# Patient Record
Sex: Female | Born: 1955 | Race: White | Hispanic: No | Marital: Married
Health system: Southern US, Community
[De-identification: ages and names within clinical notes are randomized; demographics above are authoritative.]

## PROBLEM LIST (undated history)

## (undated) DIAGNOSIS — R7989 Other specified abnormal findings of blood chemistry: Secondary | ICD-10-CM

## (undated) DIAGNOSIS — J309 Allergic rhinitis, unspecified: Secondary | ICD-10-CM

## (undated) DIAGNOSIS — F32A Depression, unspecified: Secondary | ICD-10-CM

## (undated) DIAGNOSIS — Z860100 Personal history of colon polyps, unspecified: Secondary | ICD-10-CM

## (undated) DIAGNOSIS — F419 Anxiety disorder, unspecified: Secondary | ICD-10-CM

## (undated) DIAGNOSIS — R7303 Prediabetes: Secondary | ICD-10-CM

## (undated) DIAGNOSIS — F329 Major depressive disorder, single episode, unspecified: Secondary | ICD-10-CM

## (undated) DIAGNOSIS — D509 Iron deficiency anemia, unspecified: Secondary | ICD-10-CM

## (undated) DIAGNOSIS — I77819 Aortic ectasia, unspecified site: Secondary | ICD-10-CM

## (undated) DIAGNOSIS — K589 Irritable bowel syndrome without diarrhea: Secondary | ICD-10-CM

## (undated) DIAGNOSIS — J45909 Unspecified asthma, uncomplicated: Secondary | ICD-10-CM

## (undated) DIAGNOSIS — K219 Gastro-esophageal reflux disease without esophagitis: Secondary | ICD-10-CM

## (undated) DIAGNOSIS — J189 Pneumonia, unspecified organism: Secondary | ICD-10-CM

## (undated) DIAGNOSIS — E785 Hyperlipidemia, unspecified: Secondary | ICD-10-CM

## (undated) HISTORY — DX: Unspecified asthma, uncomplicated: J45.909

## (undated) HISTORY — DX: Irritable bowel syndrome, unspecified: K58.9

## (undated) HISTORY — DX: Major depressive disorder, single episode, unspecified: F32.9

## (undated) HISTORY — DX: Allergic rhinitis, unspecified: J30.9

## (undated) HISTORY — DX: Depression, unspecified: F32.A

## (undated) HISTORY — DX: Aortic ectasia, unspecified site: I77.819

## (undated) HISTORY — DX: Other specified abnormal findings of blood chemistry: R79.89

## (undated) HISTORY — DX: Prediabetes: R73.03

## (undated) HISTORY — DX: Gastro-esophageal reflux disease without esophagitis: K21.9

## (undated) HISTORY — DX: Personal history of colonic polyps: Z86.010

## (undated) HISTORY — DX: Anxiety disorder, unspecified: F41.9

## (undated) HISTORY — DX: Pneumonia, unspecified organism: J18.9

## (undated) HISTORY — DX: Iron deficiency anemia, unspecified: D50.9

## (undated) HISTORY — PX: TONSILLECTOMY AND ADENOIDECTOMY: SHX28

## (undated) HISTORY — DX: Personal history of colon polyps, unspecified: Z86.0100

## (undated) HISTORY — DX: Hyperlipidemia, unspecified: E78.5

---

## 2006-01-08 ENCOUNTER — Emergency Department (HOSPITAL_COMMUNITY): Admission: EM | Admit: 2006-01-08 | Discharge: 2006-01-08 | Payer: Self-pay | Admitting: *Deleted

## 2006-12-16 ENCOUNTER — Ambulatory Visit (HOSPITAL_COMMUNITY): Admission: RE | Admit: 2006-12-16 | Discharge: 2006-12-16 | Payer: Self-pay | Admitting: Obstetrics and Gynecology

## 2007-07-28 ENCOUNTER — Other Ambulatory Visit: Admission: RE | Admit: 2007-07-28 | Discharge: 2007-07-28 | Payer: Self-pay | Admitting: Family Medicine

## 2008-08-17 ENCOUNTER — Other Ambulatory Visit: Admission: RE | Admit: 2008-08-17 | Discharge: 2008-08-17 | Payer: Self-pay | Admitting: Family Medicine

## 2008-09-12 ENCOUNTER — Ambulatory Visit: Payer: Self-pay | Admitting: Gynecology

## 2008-10-03 HISTORY — PX: ENDOMETRIAL ABLATION: SHX621

## 2008-10-12 ENCOUNTER — Ambulatory Visit: Payer: Self-pay | Admitting: Gynecology

## 2008-10-17 ENCOUNTER — Ambulatory Visit: Payer: Self-pay | Admitting: Gynecology

## 2008-10-31 ENCOUNTER — Ambulatory Visit: Payer: Self-pay | Admitting: Gynecology

## 2009-08-02 ENCOUNTER — Other Ambulatory Visit: Admission: RE | Admit: 2009-08-02 | Discharge: 2009-08-02 | Payer: Self-pay | Admitting: Gynecology

## 2009-08-02 ENCOUNTER — Ambulatory Visit: Payer: Self-pay | Admitting: Gynecology

## 2010-08-08 ENCOUNTER — Ambulatory Visit: Payer: Self-pay | Admitting: Gynecology

## 2011-06-19 ENCOUNTER — Ambulatory Visit (INDEPENDENT_AMBULATORY_CARE_PROVIDER_SITE_OTHER): Payer: BC Managed Care – PPO | Admitting: Gynecology

## 2011-06-19 ENCOUNTER — Encounter: Payer: Self-pay | Admitting: Gynecology

## 2011-06-19 VITALS — BP 106/68

## 2011-06-19 DIAGNOSIS — N951 Menopausal and female climacteric states: Secondary | ICD-10-CM

## 2011-06-19 MED ORDER — ESTRADIOL 0.05 MG/24HR TD PTTW: MEDICATED_PATCH | TRANSDERMAL | Status: DC

## 2011-06-19 MED ORDER — PROGESTERONE MICRONIZED 100 MG PO CAPS: 100.0000 mg | ORAL_CAPSULE | Freq: Every day | ORAL | Status: DC

## 2011-06-19 NOTE — Progress Notes (Signed)
Patient presents with history of last menses year and a half ago with worsening hot flushes night sweats memory issues. I saw her a year ago talked about starting fluoxetine for some anxiety symptoms she never did this she tried over-the-counter soy and this does not seem to help her symptoms. I reviewed the issues of perimenopause and symptoms. The options for management include observation, OTC soy, black cohosh, prescription nonhormonal such as Effexor and prescription hormonal options. HRT was discussed to include the WHI study. The increased risk of stroke heart attack DVT uterine cancer and breast cancer was reviewed. The ACOG and NAMS statements for lowest dose for the shortest period of time discussed. Transdermal versus oral with first pass effect issues discussed. After lengthy review patient wants to go ahead and start and I prescribed Vivelle .05 patch and Prometrium 100 mg nightly. 3 month supply provided. She's actually due for her annual in December or January time frame she'll follow up at that point. If she does any bleeding or has any other issues she knows to call me.

## 2011-08-13 ENCOUNTER — Encounter: Payer: Self-pay | Admitting: Gynecology

## 2011-08-13 ENCOUNTER — Ambulatory Visit (INDEPENDENT_AMBULATORY_CARE_PROVIDER_SITE_OTHER): Payer: BC Managed Care – PPO | Admitting: Gynecology

## 2011-08-13 VITALS — BP 120/64 | Ht 64.0 in | Wt 133.0 lb

## 2011-08-13 DIAGNOSIS — Z01419 Encounter for gynecological examination (general) (routine) without abnormal findings: Secondary | ICD-10-CM

## 2011-08-13 DIAGNOSIS — N951 Menopausal and female climacteric states: Secondary | ICD-10-CM

## 2011-08-13 DIAGNOSIS — Z13220 Encounter for screening for lipoid disorders: Secondary | ICD-10-CM

## 2011-08-13 DIAGNOSIS — Z79890 Hormone replacement therapy (postmenopausal): Secondary | ICD-10-CM

## 2011-08-13 DIAGNOSIS — Z780 Asymptomatic menopausal state: Secondary | ICD-10-CM

## 2011-08-13 DIAGNOSIS — Z131 Encounter for screening for diabetes mellitus: Secondary | ICD-10-CM

## 2011-08-13 MED ORDER — PROGESTERONE MICRONIZED 100 MG PO CAPS: 100.0000 mg | ORAL_CAPSULE | Freq: Every day | ORAL | Status: DC

## 2011-08-13 MED ORDER — ESTRADIOL 0.05 MG/24HR TD PTTW: MEDICATED_PATCH | TRANSDERMAL | Status: DC

## 2011-08-13 NOTE — Progress Notes (Signed)
Terri Gallegos 07-Feb-1956 [id number]        55 y.o.  for annual exam.  Overall doing well. Recently started on HRT of Vivelle-Dot 0.05 and Prometrium 100 mg at bedtime and has had a great response with no hot flushes.  Past medical history,surgical history, medications, allergies, family history and social history were all reviewed and documented in the EPIC chart. ROS:  Was performed and pertinent positives and negatives are included in the history.  Exam: chaperone present Filed Vitals:   08/13/11 0853  BP: 120/64   General appearance  Normal Skin grossly normal Head/Neck normal with no cervical or supraclavicular adenopathy thyroid normal Lungs  clear Cardiac RR, without RMG Abdominal  soft, nontender, without masses, organomegaly or hernia Breasts  examined lying and sitting without masses, retractions, discharge or axillary adenopathy. Pelvic  Ext/BUS/vagina  normal   Cervix  normal  Pap done  Uterus  retroverted, normal size, shape and contour, midline and mobile nontender   Adnexa  Without masses or tenderness    Anus and perineum  normal   Rectovaginal  normal sphincter tone without palpated masses or tenderness.    Assessment/Plan:  55 y.o. female for annual exam.    1. HRT. I again reviewed the issues of HRT, WHI study with increased risk of stroke, heart attack, DVT, breast cancer. Patient's doing well wants to continue it I refilled her prescription. She knows to report any bleeding. 2. Bone health. We'll check vitamin D level and ordered a baseline DEXA. Increase calcium and vitamin D was recommended dosages reviewed. 3. Pap smear. Patient has not had a Pap smear in several years and I did one today. She has no history of significant atypia in the past, assuming this is normal we'll plan on every 3 year interval according to current guidelines. 4. Breast self. SBE monthly reviewed. She is due for her mammogram January 2013 and I reminded her to schedule  this. 5. Colonoscopy. Her last colonoscopy was 3 years ago she is up to date and will follow up per their recommendations. 6. Health maintenance. Baseline CBC glucose lipid profile urinalysis along with her vitamin D level was ordered. Assuming she continues well on HRT she will see me in a year sooner as needed.    Dara Lords MD, 9:39 AM 08/13/2011

## 2011-08-13 NOTE — Patient Instructions (Signed)
Continue with hormone replacement as discussed. Report any bleeding. Follow up for annual exam in one year. Schedule mammogram in January.

## 2011-09-12 ENCOUNTER — Other Ambulatory Visit: Payer: Self-pay | Admitting: Gynecology

## 2011-10-01 ENCOUNTER — Ambulatory Visit (INDEPENDENT_AMBULATORY_CARE_PROVIDER_SITE_OTHER): Payer: BC Managed Care – PPO

## 2011-10-01 DIAGNOSIS — Z780 Asymptomatic menopausal state: Secondary | ICD-10-CM

## 2011-10-01 DIAGNOSIS — Z13820 Encounter for screening for osteoporosis: Secondary | ICD-10-CM

## 2011-10-07 ENCOUNTER — Encounter: Payer: Self-pay | Admitting: Gynecology

## 2012-02-04 ENCOUNTER — Other Ambulatory Visit: Payer: Self-pay | Admitting: *Deleted

## 2012-02-04 DIAGNOSIS — E559 Vitamin D deficiency, unspecified: Secondary | ICD-10-CM

## 2012-07-17 ENCOUNTER — Other Ambulatory Visit: Payer: Self-pay | Admitting: Gynecology

## 2012-08-13 ENCOUNTER — Encounter: Payer: Self-pay | Admitting: Gynecology

## 2012-08-13 ENCOUNTER — Ambulatory Visit (INDEPENDENT_AMBULATORY_CARE_PROVIDER_SITE_OTHER): Payer: BC Managed Care – PPO | Admitting: Gynecology

## 2012-08-13 VITALS — BP 112/64 | Ht 64.0 in | Wt 135.0 lb

## 2012-08-13 DIAGNOSIS — L719 Rosacea, unspecified: Secondary | ICD-10-CM

## 2012-08-13 DIAGNOSIS — J069 Acute upper respiratory infection, unspecified: Secondary | ICD-10-CM

## 2012-08-13 DIAGNOSIS — Z79890 Hormone replacement therapy (postmenopausal): Secondary | ICD-10-CM

## 2012-08-13 DIAGNOSIS — Z13220 Encounter for screening for lipoid disorders: Secondary | ICD-10-CM

## 2012-08-13 DIAGNOSIS — Z01419 Encounter for gynecological examination (general) (routine) without abnormal findings: Secondary | ICD-10-CM

## 2012-08-13 LAB — COMPREHENSIVE METABOLIC PANEL
ALT: 24 U/L (ref 0–35)
AST: 23 U/L (ref 0–37)
Alkaline Phosphatase: 73 U/L (ref 39–117)
BUN: 11 mg/dL (ref 6–23)
Calcium: 9.1 mg/dL (ref 8.4–10.5)
Creat: 0.83 mg/dL (ref 0.50–1.10)
Total Bilirubin: 0.6 mg/dL (ref 0.3–1.2)

## 2012-08-13 LAB — CBC WITH DIFFERENTIAL/PLATELET
Basophils Absolute: 0 10*3/uL (ref 0.0–0.1)
Basophils Relative: 1 % (ref 0–1)
Eosinophils Absolute: 0.2 10*3/uL (ref 0.0–0.7)
Eosinophils Relative: 3 % (ref 0–5)
HCT: 41.2 % (ref 36.0–46.0)
Lymphocytes Relative: 25 % (ref 12–46)
MCHC: 34.2 g/dL (ref 30.0–36.0)
MCV: 90.5 fL (ref 78.0–100.0)
Monocytes Absolute: 0.4 10*3/uL (ref 0.1–1.0)
Platelets: 251 10*3/uL (ref 150–400)
RDW: 12.9 % (ref 11.5–15.5)
WBC: 6.1 10*3/uL (ref 4.0–10.5)

## 2012-08-13 LAB — LIPID PANEL
HDL: 64 mg/dL (ref >39)
LDL Cholesterol: 108 mg/dL — ABNORMAL HIGH (ref 0–99)
Total CHOL/HDL Ratio: 2.9 Ratio
VLDL: 15 mg/dL (ref 0–40)

## 2012-08-13 LAB — TSH: TSH: 1.407 u[IU]/mL (ref 0.350–4.500)

## 2012-08-13 MED ORDER — BENZONATATE 200 MG PO CAPS: 200.0000 mg | ORAL_CAPSULE | Freq: Three times a day (TID) | ORAL | Status: DC | PRN

## 2012-08-13 MED ORDER — ESTRADIOL 0.05 MG/24HR TD PTTW: 1.0000 | MEDICATED_PATCH | TRANSDERMAL | Status: DC

## 2012-08-13 MED ORDER — PROGESTERONE MICRONIZED 100 MG PO CAPS: 100.0000 mg | ORAL_CAPSULE | Freq: Every day | ORAL | Status: DC

## 2012-08-13 MED ORDER — METRONIDAZOLE 0.75 % EX CREA: TOPICAL_CREAM | Freq: Two times a day (BID) | CUTANEOUS | Status: DC

## 2012-08-13 NOTE — Progress Notes (Signed)
Terri Gallegos 06-Dec-1955 [id number]        56 y.o.  G3P3003 for annual exam.  Several issues noted below.  Past medical history,surgical history, medications, allergies, family history and social history were all reviewed and documented in the EPIC chart. ROS:  Was performed and pertinent positives and negatives are included in the history.  Exam: Sherrilyn Rist assistant Filed Vitals:   08/13/12 1010  BP: 112/64  Height: 5\' 4"  (1.626 m)  Weight: 135 lb (61.236 kg)   General appearance  Normal Skin grossly normal Head/Neck normal with no cervical or supraclavicular adenopathy thyroid normal Lungs  clear Cardiac RR, without RMG Abdominal  soft, nontender, without masses, organomegaly or hernia Breasts  examined lying and sitting without masses, retractions, discharge or axillary adenopathy. Pelvic  Ext/BUS/vagina  normal with atrophic changes  Cervix  normal with atrophic changes  Uterus  anteverted, normal size, shape and contour, midline and mobile nontender   Adnexa  Without masses or tenderness    Anus and perineum  normal   Rectovaginal  normal sphincter tone without palpated masses or tenderness.    Assessment/Plan:  56 y.o. G51P3003 female for annual exam.   1. Postmenopausal on HRT of Vivelle 0.05 patch and Prometrium 100 mg nightly. No bleeding with great symptom relief, started last year.  Again reviewed HRT to include ACOG and NAMS statements the lowest dose the shortest period of time. Risks of stroke heart attack DVT breast cancer reviewed. Patient wants to continue I refilled her prescriptions x1 year.  Knows to report any bleeding. 2. Persistent cough. Treated with course of antibiotics Tussionex by her primary. Getting better over all no sputum or fevers or chills. Tessalon Perles 200 #20 with one refill provided, follow up with primary physician if persists or worsens. 3. Mammography 10/2011. Follow up for annual mammography when due. SBE monthly reviewed. 4. DEXA 09/2011  normal.  Repeat in 5 year interval. Vitamin D last year was low a 22. She was to increase vitamin D intake in follow up and repeat value which he never did. Check vitamin D today. 5. Colonoscopy 3 years ago. Patient will follow up at recommended intervals. 6. Pap smear 08/2011.  No Pap smear done today. No history of significant abnormal Pap smears. Plan 3 year follow up Pap. 7. Rosacea. Patient asked for refill of her metronidazole cream 0.75% that her dermatologist has prescribed she ran out. I refilled her times 2. 8. Health maintenance. Baseline CBC comprehensive metabolic panel lipid profile TSH urinalysis ordered along with her vitamin D. Follow up one year, sooner as needed.    Dara Lords MD, 10:35 AM 08/13/2012

## 2012-08-13 NOTE — Patient Instructions (Signed)
Follow up in one year for annual exam. Continue on hormone replacement. Call if any bleeding or any questions at all.

## 2012-08-14 ENCOUNTER — Other Ambulatory Visit: Payer: Self-pay | Admitting: Gynecology

## 2012-08-14 LAB — URINALYSIS W MICROSCOPIC + REFLEX CULTURE
Bacteria, UA: NONE SEEN
Casts: NONE SEEN
Hgb urine dipstick: NEGATIVE
Ketones, ur: NEGATIVE mg/dL
Nitrite: NEGATIVE
Protein, ur: NEGATIVE mg/dL
pH: 7 (ref 5.0–8.0)

## 2012-08-14 LAB — VITAMIN D 25 HYDROXY (VIT D DEFICIENCY, FRACTURES): Vit D, 25-Hydroxy: 26 ng/mL — ABNORMAL LOW (ref 30–89)

## 2012-10-04 ENCOUNTER — Other Ambulatory Visit: Payer: Self-pay | Admitting: Gynecology

## 2012-10-07 ENCOUNTER — Encounter: Payer: Self-pay | Admitting: Gynecology

## 2012-10-17 ENCOUNTER — Other Ambulatory Visit: Payer: Self-pay

## 2012-11-03 ENCOUNTER — Other Ambulatory Visit: Payer: Self-pay | Admitting: Gynecology

## 2012-12-03 ENCOUNTER — Other Ambulatory Visit: Payer: Self-pay | Admitting: Gynecology

## 2013-01-21 ENCOUNTER — Other Ambulatory Visit: Payer: Self-pay | Admitting: *Deleted

## 2013-01-21 MED ORDER — MINIVELLE 0.05 MG/24HR TD PTTW: 1.0000 | MEDICATED_PATCH | TRANSDERMAL | Status: DC

## 2013-02-02 ENCOUNTER — Other Ambulatory Visit: Payer: Self-pay | Admitting: Gynecology

## 2013-02-02 MED ORDER — MINIVELLE 0.05 MG/24HR TD PTTW: 1.0000 | MEDICATED_PATCH | TRANSDERMAL | Status: DC

## 2013-04-28 ENCOUNTER — Encounter: Payer: Self-pay | Admitting: Gynecology

## 2013-04-28 ENCOUNTER — Ambulatory Visit (INDEPENDENT_AMBULATORY_CARE_PROVIDER_SITE_OTHER): Payer: 59 | Admitting: Gynecology

## 2013-04-28 DIAGNOSIS — L723 Sebaceous cyst: Secondary | ICD-10-CM

## 2013-04-28 DIAGNOSIS — L989 Disorder of the skin and subcutaneous tissue, unspecified: Secondary | ICD-10-CM

## 2013-04-28 NOTE — Progress Notes (Signed)
Patient presents complaining of perirectal bumps. Notes onset of a rash on her right forearm that she has an appointment to see her dermatologist but then subsequently developed a red rash on her buttocks and then bumps around her anal area.  Exam was Kim Asst. Upper extremity shows a scaly patch lesion right forearm. Suspicious for squamous cell/basal cell carcinoma. May be unusual seborrheic keratoses.  Pelvic External with 3 small classic appearing sebaceous cysts right of the anal area. The largest is 1 cm and pointing. The other 2 are small several millimeters. Buttocks area without evidence of rash.  Procedure: Area overlying the pointing larger sebaceous cyst was cleansed with alcohol and using an 18-gauge needle the pointing area was opened and sebaceous-type material extruded to including a seed area.  The area was completely emptied. Patient tolerated well.  Assessment and plan: 1. Lesion right forearm suspicious for skin cancer. Patient has appointment with dermatologist for a scheduled and will followup with them. I discussed my concern with the patient and she knows importance of followup. 2. Sebaceous cyst right peri-anal area, largest area drained. 2 smaller areas which have asked the patient to watch. As long as they remain the same or resolve then we will follow. If they enlarge or become painful or change at all she is to represent for evaluation. She has her annual exam scheduled in several months and we'll reassess at that time as long as they remain quiet. 3. Reported rash upper buttocks has resolved with no residual abnormalities.

## 2013-04-28 NOTE — Patient Instructions (Addendum)
Monitor the perianal bumps. As long as they remain stable then we will follow. If they enlarger or change then followup with me. Followup to see the dermatologist to assess the area on your right forearm.

## 2013-07-08 ENCOUNTER — Other Ambulatory Visit: Payer: Self-pay

## 2013-08-17 ENCOUNTER — Ambulatory Visit (INDEPENDENT_AMBULATORY_CARE_PROVIDER_SITE_OTHER): Payer: 59 | Admitting: Gynecology

## 2013-08-17 ENCOUNTER — Encounter: Payer: Self-pay | Admitting: Gynecology

## 2013-08-17 VITALS — BP 110/60 | Ht 64.0 in | Wt 138.0 lb

## 2013-08-17 DIAGNOSIS — Z01419 Encounter for gynecological examination (general) (routine) without abnormal findings: Secondary | ICD-10-CM

## 2013-08-17 DIAGNOSIS — E559 Vitamin D deficiency, unspecified: Secondary | ICD-10-CM

## 2013-08-17 DIAGNOSIS — Z79890 Hormone replacement therapy (postmenopausal): Secondary | ICD-10-CM

## 2013-08-17 DIAGNOSIS — F411 Generalized anxiety disorder: Secondary | ICD-10-CM

## 2013-08-17 LAB — CBC WITH DIFFERENTIAL/PLATELET
Basophils Absolute: 0 10*3/uL (ref 0.0–0.1)
Basophils Relative: 1 % (ref 0–1)
Hemoglobin: 13.8 g/dL (ref 12.0–15.0)
Lymphocytes Relative: 34 % (ref 12–46)
MCHC: 34.8 g/dL (ref 30.0–36.0)
Neutro Abs: 2.1 10*3/uL (ref 1.7–7.7)
Neutrophils Relative %: 52 % (ref 43–77)
RDW: 13.6 % (ref 11.5–15.5)
WBC: 3.9 10*3/uL — ABNORMAL LOW (ref 4.0–10.5)

## 2013-08-17 MED ORDER — FLUOXETINE HCL 20 MG PO CAPS: 20.0000 mg | ORAL_CAPSULE | Freq: Every day | ORAL | Status: DC

## 2013-08-17 NOTE — Patient Instructions (Signed)
Start on Prozac as we discussed. After being on this for several weeks stopped her hormone replacement therapy and we'll see how you do. Call me if you have any questions or issues. Followup in one year for annual exam.

## 2013-08-17 NOTE — Progress Notes (Signed)
Terri Gallegos [birth year] [id number]        57 y.o.  G3P3003 for annual exam.  Several issues noted below.  Past medical history,surgical history, problem list, medications, allergies, family history and social history were all reviewed and documented in the EPIC chart.  ROS:  Performed and pertinent positives and negatives are included in the history, assessment and plan .  Exam: Kim assistant Filed Vitals:   08/17/13 0820  BP: 110/60  Height: 5\' 4"  (1.626 m)  Weight: 138 lb (62.596 kg)   General appearance  Normal Skin grossly normal Head/Neck normal with no cervical or supraclavicular adenopathy thyroid normal Lungs  clear Cardiac RR, without RMG Abdominal  soft, nontender, without masses, organomegaly or hernia Breasts  examined lying and sitting without masses, retractions, discharge or axillary adenopathy. Pelvic  Ext/BUS/vagina  Normal   Cervix  Normal   Uterus  retroverted, normal size, shape and contour, midline and mobile nontender   Adnexa  Without masses or tenderness    Anus and perineum  Normal   Rectovaginal  Normal sphincter tone without palpated masses or tenderness.    Assessment/Plan:  57 y.o. G70P3003 female for annual exam.   1. Postmenopausal/HRT. Patient is on Minivelle 0.05 mg and Prometrium 100 mg nightly. Had questions about stopping it. I again reviewed the whole issue of HRT to includes the WHI study with risks of stroke heart attack DVT and breast cancer. The ACOG and NAMS statements for lowest dose for shortest period of time also reviewed. Transdermal versus oral first-pass effect issues discussed. She is having a lot of issues with the patches for her skin breakout. After lengthy discussion she is going to try to wean after starting Prozac per #2 we'll see how she does. If she does have significant symptoms then she'll call and we'll reinitiate HRT but start her on estradiol oral 0.5 mg along with her Prometrium 100 mg nightly. 2. Anxiety. Patient is  having a lot of anxiety dealing with her son who has an addiction. Various options and strategies reviewed and ultimately we decided a trial of fluoxetine 20 mg daily. Side effect profile and risks to include suicide ideation discussed. Patient will call me if she has any issues. #30 with 11 refills provided. 3. Pap smear 2012. No Pap smear done today. No history of significant abnormal Pap smears. Plan repeat Pap smear next year a 3 year interval. 4. Mammography 10/2012. Continue with annual mammography. SBE monthly reviewed. 5. DEXA 2013 normal. Does have a history of vitamin D deficiency. Check vitamin D level today. Increase vitamin D/calcium recommendations reviewed. 6. Colonoscopy 3-4 years ago. Repeat at their recommended interval. 7. Health maintenance. Baseline CBC comprehensive metabolic panel lipid profile TSH vitamin D urinalysis ordered. Followup 1 year, sooner as noted above.   Note: This document was prepared with digital dictation and possible smart phrase technology. Any transcriptional errors that result from this process are unintentional.   Dara Lords MD, 8:46 AM 08/17/2013

## 2013-08-18 LAB — COMPREHENSIVE METABOLIC PANEL
ALT: 17 U/L (ref 0–35)
AST: 18 U/L (ref 0–37)
Albumin: 4 g/dL (ref 3.5–5.2)
Alkaline Phosphatase: 57 U/L (ref 39–117)
Chloride: 106 mEq/L (ref 96–112)
Potassium: 4 mEq/L (ref 3.5–5.3)
Sodium: 139 mEq/L (ref 135–145)
Total Protein: 6.2 g/dL (ref 6.0–8.3)

## 2013-08-18 LAB — URINALYSIS W MICROSCOPIC + REFLEX CULTURE

## 2013-08-18 LAB — TSH: TSH: 2.58 u[IU]/mL (ref 0.350–4.500)

## 2013-08-18 LAB — LIPID PANEL: LDL Cholesterol: 107 mg/dL — ABNORMAL HIGH (ref 0–99)

## 2013-08-19 ENCOUNTER — Telehealth: Payer: Self-pay

## 2013-08-19 ENCOUNTER — Other Ambulatory Visit: Payer: Self-pay

## 2013-08-19 NOTE — Telephone Encounter (Signed)
You put her on Prozac at visit. She took first one night before last and had a bad day yesterday feeling agitated, nervous and restless. Even went to see the school nurse and her BP was up. She said it is never high.  She thought maybe it was because she took it with her Benadryl hs that she uses to help sleep. So, last night she did not take her Doree Barthel and she is having a much better day today except is very sleepy due to fact she was awake every hour without taking her Benadryl.  She said she needs to sleep and the last week or two has not even been sleeping well with Benadryl.  She wanted me to run that by you and see what you recommend she do to help with sleep issues and do you think that the way she felt yesterday might be because of taking it with Benadryl.

## 2013-08-19 NOTE — Telephone Encounter (Signed)
Patient said lately the Benadryl had not been helping her sleep. She is awake every hour the last few weeks.  She said she meant to ask at visit if you would prescribe something.  She will try the Prozac am and take her Benadryl tonight. Rx?

## 2013-08-19 NOTE — Telephone Encounter (Signed)
I would recommend trying the Prozac in the morning and then she can take the Benadryl at nighttime and see how that does. If need be we can cut the dose in half and do Prozac 10 mg

## 2013-08-20 ENCOUNTER — Other Ambulatory Visit: Payer: Self-pay | Admitting: Gynecology

## 2013-08-20 DIAGNOSIS — Z01419 Encounter for gynecological examination (general) (routine) without abnormal findings: Secondary | ICD-10-CM

## 2013-08-20 MED ORDER — ZOLPIDEM TARTRATE 5 MG PO TABS: 5.0000 mg | ORAL_TABLET | Freq: Every evening | ORAL | Status: DC | PRN

## 2013-08-20 NOTE — Telephone Encounter (Signed)
Patient advised. Rx in. 

## 2013-08-20 NOTE — Telephone Encounter (Signed)
We can try a short course of Ambien 5 mg to see if we cannot break the cycle. I do not want to see her needing it every night though. #30 with 1 refill

## 2013-08-20 NOTE — Progress Notes (Signed)
Ambien 5mg  Rx was called in to pharmacy.

## 2013-08-23 ENCOUNTER — Other Ambulatory Visit: Payer: Self-pay | Admitting: *Deleted

## 2013-09-01 ENCOUNTER — Other Ambulatory Visit: Payer: 59

## 2013-09-16 ENCOUNTER — Other Ambulatory Visit: Payer: Self-pay

## 2013-09-16 MED ORDER — ZOLPIDEM TARTRATE 5 MG PO TABS: 5.0000 mg | ORAL_TABLET | Freq: Every evening | ORAL | Status: DC | PRN

## 2013-09-16 NOTE — Telephone Encounter (Signed)
Called into pharmacy

## 2013-09-21 ENCOUNTER — Other Ambulatory Visit: Payer: Self-pay

## 2013-09-21 MED ORDER — PROGESTERONE MICRONIZED 100 MG PO CAPS: 100.0000 mg | ORAL_CAPSULE | Freq: Every day | ORAL | Status: DC

## 2013-09-24 ENCOUNTER — Telehealth: Payer: Self-pay | Admitting: *Deleted

## 2013-09-24 NOTE — Telephone Encounter (Signed)
(  Pt aware you are out of the office) pt was prescribed Prozac 20 mg on OV 08/17/13 she stopped medication because it made her feel anxious, no appetite just didn't feel well. Pt spoke with the pharmacist and he suggest maybe lexapro. She also mention skin irritation from minivelle patches and how she really would like to stop taking HRT, pharmacist told her that maybe Effexor would help her as well.  Pt would like to know what you think? Please advise

## 2013-09-27 MED ORDER — VENLAFAXINE HCL ER 37.5 MG PO CP24: 37.5000 mg | ORAL_CAPSULE | Freq: Every day | ORAL | Status: DC

## 2013-09-27 NOTE — Telephone Encounter (Signed)
Recommend stopping HRT. Start Effexor XR 37.5 mg daily x2 weeks. Call me at the end of the 2 weeks to see how she's doing. At that point we will make a decision about increasing her to the 75 mg if she is doing well. Hopefully Effexor will address both her menopausal symptoms and her anxiety.

## 2013-09-27 NOTE — Telephone Encounter (Signed)
Pt informed with the below note, rx sent. 

## 2013-10-14 ENCOUNTER — Telehealth: Payer: Self-pay | Admitting: *Deleted

## 2013-10-14 MED ORDER — VENLAFAXINE HCL ER 37.5 MG PO CP24: 37.5000 mg | ORAL_CAPSULE | Freq: Every day | ORAL | Status: DC

## 2013-10-14 NOTE — Telephone Encounter (Signed)
Pt had good results from effexor XR and refill will be sent,pt did not want to continue with HRT so she is going to stop this.

## 2013-10-14 NOTE — Telephone Encounter (Signed)
Pt calling with update from telephone encounter 09/24/13. Pt never stopped taking HRT as directed, she took the Effexor XR 37.5 mg daily x2 weeks as well with HRT, I explained to patient that was not your directions, so pt is calling today requesting refill on Effexor 37.5. Please advise

## 2013-10-14 NOTE — Telephone Encounter (Signed)
I assume that she had a good response to the Effexor XR and if so then we can refill the 37.5 mg. She will have to decide about the HRT. If she wants to continue this also than that is okay

## 2013-12-08 ENCOUNTER — Telehealth: Payer: Self-pay

## 2013-12-08 MED ORDER — VENLAFAXINE HCL ER 75 MG PO CP24: 75.0000 mg | ORAL_CAPSULE | Freq: Every day | ORAL | Status: DC

## 2013-12-08 NOTE — Telephone Encounter (Signed)
Okay to increase Effexor XR to 75 mg daily #30 with refills through her next annual

## 2013-12-08 NOTE — Telephone Encounter (Addendum)
Patient said she has been on Effexor x ~2 mos . She said 3 weeks ago she d/c her HRT. Now she is having bad hotflashes. She is questioning can the Effexor be increased.  Looks like she is taking 37.5.

## 2014-06-17 ENCOUNTER — Other Ambulatory Visit: Payer: Self-pay

## 2014-07-04 ENCOUNTER — Encounter: Payer: Self-pay | Admitting: Gynecology

## 2014-07-12 ENCOUNTER — Encounter: Payer: Self-pay | Admitting: Gynecology

## 2014-07-26 ENCOUNTER — Other Ambulatory Visit: Payer: Self-pay | Admitting: Gynecology

## 2014-08-18 ENCOUNTER — Ambulatory Visit (INDEPENDENT_AMBULATORY_CARE_PROVIDER_SITE_OTHER): Payer: 59 | Admitting: Gynecology

## 2014-08-18 ENCOUNTER — Encounter: Payer: Self-pay | Admitting: Gynecology

## 2014-08-18 ENCOUNTER — Other Ambulatory Visit (HOSPITAL_COMMUNITY)
Admission: RE | Admit: 2014-08-18 | Discharge: 2014-08-18 | Disposition: A | Discharge status: Home / Self Care | Payer: 59 | Source: Ambulatory Visit | Attending: Gynecology | Admitting: Gynecology

## 2014-08-18 VITALS — BP 114/60 | Ht 64.0 in | Wt 125.0 lb

## 2014-08-18 DIAGNOSIS — Z01419 Encounter for gynecological examination (general) (routine) without abnormal findings: Secondary | ICD-10-CM | POA: Diagnosis not present

## 2014-08-18 DIAGNOSIS — Z1151 Encounter for screening for human papillomavirus (HPV): Secondary | ICD-10-CM | POA: Insufficient documentation

## 2014-08-18 DIAGNOSIS — N888 Other specified noninflammatory disorders of cervix uteri: Secondary | ICD-10-CM

## 2014-08-18 LAB — CBC WITH DIFFERENTIAL/PLATELET
BASOS ABS: 0 10*3/uL (ref 0.0–0.1)
Basophils Relative: 0 % (ref 0–1)
Eosinophils Absolute: 0.3 10*3/uL (ref 0.0–0.7)
Eosinophils Relative: 5 % (ref 0–5)
HCT: 40 % (ref 36.0–46.0)
Hemoglobin: 14.1 g/dL (ref 12.0–15.0)
Lymphocytes Relative: 33 % (ref 12–46)
Lymphs Abs: 1.7 10*3/uL (ref 0.7–4.0)
MCH: 30.9 pg (ref 26.0–34.0)
MCHC: 35.3 g/dL (ref 30.0–36.0)
MCV: 87.5 fL (ref 78.0–100.0)
MONO ABS: 0.3 10*3/uL (ref 0.1–1.0)
MONOS PCT: 5 % (ref 3–12)
MPV: 9.7 fL (ref 9.4–12.4)
NEUTROS ABS: 3 10*3/uL (ref 1.7–7.7)
NEUTROS PCT: 57 % (ref 43–77)
PLATELETS: 252 10*3/uL (ref 150–400)
RBC: 4.57 MIL/uL (ref 3.87–5.11)
RDW: 12.7 % (ref 11.5–15.5)
WBC: 5.3 10*3/uL (ref 4.0–10.5)

## 2014-08-18 LAB — COMPREHENSIVE METABOLIC PANEL
ALBUMIN: 4.3 g/dL (ref 3.5–5.2)
ALT: 31 U/L (ref 0–35)
AST: 31 U/L (ref 0–37)
Alkaline Phosphatase: 72 U/L (ref 39–117)
BILIRUBIN TOTAL: 0.5 mg/dL (ref 0.2–1.2)
BUN: 15 mg/dL (ref 6–23)
CO2: 28 mEq/L (ref 19–32)
Calcium: 9.3 mg/dL (ref 8.4–10.5)
Chloride: 101 mEq/L (ref 96–112)
Creat: 0.97 mg/dL (ref 0.50–1.10)
GLUCOSE: 83 mg/dL (ref 70–99)
POTASSIUM: 4 meq/L (ref 3.5–5.3)
SODIUM: 138 meq/L (ref 135–145)
Total Protein: 6.6 g/dL (ref 6.0–8.3)

## 2014-08-18 LAB — LIPID PANEL
Cholesterol: 233 mg/dL — ABNORMAL HIGH (ref 0–200)
HDL: 75 mg/dL (ref >39)
LDL Cholesterol: 142 mg/dL — ABNORMAL HIGH (ref 0–99)
TRIGLYCERIDES: 81 mg/dL (ref <150)
Total CHOL/HDL Ratio: 3.1 Ratio
VLDL: 16 mg/dL (ref 0–40)

## 2014-08-18 LAB — TSH: TSH: 2.39 u[IU]/mL (ref 0.350–4.500)

## 2014-08-18 MED ORDER — VENLAFAXINE HCL ER 75 MG PO CP24: 75.0000 mg | ORAL_CAPSULE | Freq: Every day | ORAL | Status: DC

## 2014-08-18 MED ORDER — ZOLPIDEM TARTRATE 5 MG PO TABS: 5.0000 mg | ORAL_TABLET | Freq: Every evening | ORAL | Status: DC | PRN

## 2014-08-18 MED ORDER — METRONIDAZOLE 0.75 % EX CREA: TOPICAL_CREAM | Freq: Two times a day (BID) | CUTANEOUS | Status: AC

## 2014-08-18 NOTE — Patient Instructions (Signed)
Office will call you with biopsy results.  You may obtain a copy of any labs that were done today by logging onto MyChart as outlined in the instructions provided with your AVS (after visit summary). The office will not call with normal lab results but certainly if there are any significant abnormalities then we will contact you.   Health Maintenance, Female A healthy lifestyle and preventative care can promote health and wellness.  Maintain regular health, dental, and eye exams.  Eat a healthy diet. Foods like vegetables, fruits, whole grains, low-fat dairy products, and lean protein foods contain the nutrients you need without too many calories. Decrease your intake of foods high in solid fats, added sugars, and salt. Get information about a proper diet from your caregiver, if necessary.  Regular physical exercise is one of the most important things you can do for your health. Most adults should get at least 150 minutes of moderate-intensity exercise (any activity that increases your heart rate and causes you to sweat) each week. In addition, most adults need muscle-strengthening exercises on 2 or more days a week.   Maintain a healthy weight. The body mass index (BMI) is a screening tool to identify possible weight problems. It provides an estimate of body fat based on height and weight. Your caregiver can help determine your BMI, and can help you achieve or maintain a healthy weight. For adults 20 years and older:  A BMI below 18.5 is considered underweight.  A BMI of 18.5 to 24.9 is normal.  A BMI of 25 to 29.9 is considered overweight.  A BMI of 30 and above is considered obese.  Maintain normal blood lipids and cholesterol by exercising and minimizing your intake of saturated fat. Eat a balanced diet with plenty of fruits and vegetables. Blood tests for lipids and cholesterol should begin at age 20 and be repeated every 5 years. If your lipid or cholesterol levels are high, you are  over 50, or you are a high risk for heart disease, you may need your cholesterol levels checked more frequently.Ongoing high lipid and cholesterol levels should be treated with medicines if diet and exercise are not effective.  If you smoke, find out from your caregiver how to quit. If you do not use tobacco, do not start.  Lung cancer screening is recommended for adults aged 55 80 years who are at high risk for developing lung cancer because of a history of smoking. Yearly low-dose computed tomography (CT) is recommended for people who have at least a 30-pack-year history of smoking and are a current smoker or have quit within the past 15 years. A pack year of smoking is smoking an average of 1 pack of cigarettes a day for 1 year (for example: 1 pack a day for 30 years or 2 packs a day for 15 years). Yearly screening should continue until the smoker has stopped smoking for at least 15 years. Yearly screening should also be stopped for people who develop a health problem that would prevent them from having lung cancer treatment.  If you are pregnant, do not drink alcohol. If you are breastfeeding, be very cautious about drinking alcohol. If you are not pregnant and choose to drink alcohol, do not exceed 1 drink per day. One drink is considered to be 12 ounces (355 mL) of beer, 5 ounces (148 mL) of wine, or 1.5 ounces (44 mL) of liquor.  Avoid use of street drugs. Do not share needles with anyone. Ask for help   if you need support or instructions about stopping the use of drugs.  High blood pressure causes heart disease and increases the risk of stroke. Blood pressure should be checked at least every 1 to 2 years. Ongoing high blood pressure should be treated with medicines, if weight loss and exercise are not effective.  If you are 55 to 58 years old, ask your caregiver if you should take aspirin to prevent strokes.  Diabetes screening involves taking a blood sample to check your fasting blood sugar level.  This should be done once every 3 years, after age 45, if you are within normal weight and without risk factors for diabetes. Testing should be considered at a younger age or be carried out more frequently if you are overweight and have at least 1 risk factor for diabetes.  Breast cancer screening is essential preventative care for women. You should practice "breast self-awareness." This means understanding the normal appearance and feel of your breasts and may include breast self-examination. Any changes detected, no matter how small, should be reported to a caregiver. Women in their 20s and 30s should have a clinical breast exam (CBE) by a caregiver as part of a regular health exam every 1 to 3 years. After age 40, women should have a CBE every year. Starting at age 40, women should consider having a mammogram (breast X-ray) every year. Women who have a family history of breast cancer should talk to their caregiver about genetic screening. Women at a high risk of breast cancer should talk to their caregiver about having an MRI and a mammogram every year.  Breast cancer gene (BRCA)-related cancer risk assessment is recommended for women who have family members with BRCA-related cancers. BRCA-related cancers include breast, ovarian, tubal, and peritoneal cancers. Having family members with these cancers may be associated with an increased risk for harmful changes (mutations) in the breast cancer genes BRCA1 and BRCA2. Results of the assessment will determine the need for genetic counseling and BRCA1 and BRCA2 testing.  The Pap test is a screening test for cervical cancer. Women should have a Pap test starting at age 21. Between ages 21 and 29, Pap tests should be repeated every 2 years. Beginning at age 30, you should have a Pap test every 3 years as long as the past 3 Pap tests have been normal. If you had a hysterectomy for a problem that was not cancer or a condition that could lead to cancer, then you no  longer need Pap tests. If you are between ages 65 and 70, and you have had normal Pap tests going back 10 years, you no longer need Pap tests. If you have had past treatment for cervical cancer or a condition that could lead to cancer, you need Pap tests and screening for cancer for at least 20 years after your treatment. If Pap tests have been discontinued, risk factors (such as a new sexual partner) need to be reassessed to determine if screening should be resumed. Some women have medical problems that increase the chance of getting cervical cancer. In these cases, your caregiver may recommend more frequent screening and Pap tests.  The human papillomavirus (HPV) test is an additional test that may be used for cervical cancer screening. The HPV test looks for the virus that can cause the cell changes on the cervix. The cells collected during the Pap test can be tested for HPV. The HPV test could be used to screen women aged 30 years and older, and   should be used in women of any age who have unclear Pap test results. After the age of 7, women should have HPV testing at the same frequency as a Pap test.  Colorectal cancer can be detected and often prevented. Most routine colorectal cancer screening begins at the age of 20 and continues through age 36. However, your caregiver may recommend screening at an earlier age if you have risk factors for colon cancer. On a yearly basis, your caregiver may provide home test kits to check for hidden blood in the stool. Use of a small camera at the end of a tube, to directly examine the colon (sigmoidoscopy or colonoscopy), can detect the earliest forms of colorectal cancer. Talk to your caregiver about this at age 46, when routine screening begins. Direct examination of the colon should be repeated every 5 to 10 years through age 62, unless early forms of pre-cancerous polyps or small growths are found.  Hepatitis C blood testing is recommended for all people born  from [birth year] through 1965 and any individual with known risks for hepatitis C.  Practice safe sex. Use condoms and avoid high-risk sexual practices to reduce the spread of sexually transmitted infections (STIs). Sexually active women aged 55 and younger should be checked for Chlamydia, which is a common sexually transmitted infection. Older women with new or multiple partners should also be tested for Chlamydia. Testing for other STIs is recommended if you are sexually active and at increased risk.  Osteoporosis is a disease in which the bones lose minerals and strength with aging. This can result in serious bone fractures. The risk of osteoporosis can be identified using a bone density scan. Women ages 31 and over and women at risk for fractures or osteoporosis should discuss screening with their caregivers. Ask your caregiver whether you should be taking a calcium supplement or vitamin D to reduce the rate of osteoporosis.  Menopause can be associated with physical symptoms and risks. Hormone replacement therapy is available to decrease symptoms and risks. You should talk to your caregiver about whether hormone replacement therapy is right for you.  Use sunscreen. Apply sunscreen liberally and repeatedly throughout the day. You should seek shade when your shadow is shorter than you. Protect yourself by wearing long sleeves, pants, a wide-brimmed hat, and sunglasses year round, whenever you are outdoors.  Notify your caregiver of new moles or changes in moles, especially if there is a change in shape or color. Also notify your caregiver if a mole is larger than the size of a pencil eraser.  Stay current with your immunizations. Document Released: 03/04/2011 Document Revised: 12/14/2012 Document Reviewed: 03/04/2011 [facility] Patient Information 2014 [place].

## 2014-08-18 NOTE — Progress Notes (Signed)
Terri PolesDale H Birdsall 1956-03-24 [id number]        58 y.o.  G3P3003 for annual exam.  Several issues noted below.  Past medical history,surgical history, problem list, medications, allergies, family history and social history were all reviewed and documented as reviewed in the EPIC chart.  ROS:  Performed with pertinent positives and negatives included in the history, assessment and plan.   Additional significant findings :  none   Exam: [facility] personassistant Filed Vitals:   08/18/14 1155  BP: 114/60  Height: 5\' 4"  (1.626 m)  Weight: 125 lb (56.7 kg)   General appearance:  Normal affect, orientation and appearance. Skin: Grossly normal HEENT: Without gross lesions.  No cervical or supraclavicular adenopathy. Thyroid normal.  Lungs:  Clear without wheezing, rales or rhonchi Cardiac: RR, without RMG Abdominal:  Soft, nontender, without masses, guarding, rebound, organomegaly or hernia Breasts:  Examined lying and sitting without masses, retractions, discharge or axillary adenopathy. Pelvic:  Ext/BUS/vagina with atrophic changes  Cervix atrophic. Polypoid mass extruding from cervix. Biopsied off consistent with nabothian cyst.  Pap/HPV  Uterus anteverted, normal size, shape and contour, midline and mobile nontender   Adnexa  Without masses or tenderness    Anus and perineum  Normal   Rectovaginal  Normal sphincter tone without palpated masses or tenderness.    Assessment/Plan:  58 y.o. 693P3003 female for annual exam.   1. Postmenopausal/atrophic genital changes. Had been on HRT and she weaned herself off.  Subsequently started on Effexor XR 75 mg. Doing well with this without significant hot flashes night sweats. No significant vaginal dryness or dyspareunia. No vaginal bleeding. Continue to monitor. Report any vaginal bleeding. 2. Polypoid mass on cervix consistent with nabothian cyst. Biopsied off and sent to pathology. Patient will follow up for results. Assuming negative then plan expectant  management. 3. Pap smear 2012. Pap/HPV today. No history of significant abnormal Pap smears previously. Plan repeat Pap smear at 3-5 year interval assuming this Pap smear is normal. 4. Mammography 07/2014. Continue with annual mammography. SBE monthly reviewed. 5. DEXA 2013 normal.  Plan repeat at 5-10 year interval. Increased calcium vitamin D reviewed. Check vitamin D level today. 6. Colonoscopy 4-5 years ago. Reported repeat interval 10 years. 7. Health maintenance. Baseline screening CBC comprehensive metabolic panel lipid profile urinalysis vitamin D TSH ordered.  Follow up in one year, sooner as needed.     Dara LordsFONTAINE,Terri Feimster P MD, 4:57 PM 08/18/2014

## 2014-08-19 ENCOUNTER — Other Ambulatory Visit: Payer: Self-pay | Admitting: Gynecology

## 2014-08-19 DIAGNOSIS — E7800 Pure hypercholesterolemia, unspecified: Secondary | ICD-10-CM

## 2014-08-19 LAB — URINALYSIS W MICROSCOPIC + REFLEX CULTURE
BACTERIA UA: NONE SEEN
Bilirubin Urine: NEGATIVE
Casts: NONE SEEN
Crystals: NONE SEEN
Glucose, UA: NEGATIVE mg/dL
HGB URINE DIPSTICK: NEGATIVE
KETONES UR: NEGATIVE mg/dL
LEUKOCYTES UA: NEGATIVE
NITRITE: NEGATIVE
PROTEIN: NEGATIVE mg/dL
Specific Gravity, Urine: 1.005 (ref 1.005–1.030)
Squamous Epithelial / LPF: NONE SEEN
UROBILINOGEN UA: 0.2 mg/dL (ref 0.0–1.0)
pH: 7 (ref 5.0–8.0)

## 2014-08-19 LAB — VITAMIN D 25 HYDROXY (VIT D DEFICIENCY, FRACTURES): VIT D 25 HYDROXY: 31 ng/mL (ref 30–100)

## 2014-08-22 LAB — CYTOLOGY - PAP

## 2014-10-04 ENCOUNTER — Telehealth: Payer: Self-pay | Admitting: *Deleted

## 2014-10-04 NOTE — Telephone Encounter (Signed)
She can try regular Effexor 50 mg twice a day to see if this doesn't help with the night sweats. Alternative would be to take the 75 mg XR at dinnertime to see if it doesn't control night sweats later on at night.

## 2014-10-04 NOTE — Telephone Encounter (Signed)
Pt takes Effexor XR 75 mg daily for menopausal symptoms, requesting increase hot flashes have gotten worse at night. Please advise

## 2014-10-05 MED ORDER — VENLAFAXINE HCL 50 MG PO TABS: 50.0000 mg | ORAL_TABLET | Freq: Two times a day (BID) | ORAL | Status: DC

## 2014-10-05 NOTE — Telephone Encounter (Signed)
Pt informed, Rx sent for effexor 50 mg

## 2015-02-08 ENCOUNTER — Other Ambulatory Visit: Payer: Self-pay | Admitting: Gynecology

## 2015-02-08 NOTE — Telephone Encounter (Signed)
RX CALLED IN

## 2015-07-14 ENCOUNTER — Encounter: Payer: Self-pay | Admitting: Gynecology

## 2015-09-08 ENCOUNTER — Encounter: Payer: Self-pay | Admitting: Gynecology

## 2015-09-08 ENCOUNTER — Ambulatory Visit (INDEPENDENT_AMBULATORY_CARE_PROVIDER_SITE_OTHER): Payer: Managed Care, Other (non HMO) | Admitting: Gynecology

## 2015-09-08 VITALS — BP 116/70 | Ht 64.0 in | Wt 142.0 lb

## 2015-09-08 DIAGNOSIS — Z01419 Encounter for gynecological examination (general) (routine) without abnormal findings: Secondary | ICD-10-CM

## 2015-09-08 DIAGNOSIS — G4700 Insomnia, unspecified: Secondary | ICD-10-CM

## 2015-09-08 MED ORDER — ZOLPIDEM TARTRATE 10 MG PO TABS: 10.0000 mg | ORAL_TABLET | Freq: Every evening | ORAL | Status: DC | PRN

## 2015-09-08 NOTE — Progress Notes (Signed)
Antionette PolesDale H Whitfill [birth year] [id number]        60 y.o.  G3P3003  for annual exam.  Doing well.  Past medical history,surgical history, problem list, medications, allergies, family history and social history were all reviewed and documented as reviewed in the EPIC chart.  ROS:  Performed with pertinent positives and negatives included in the history, assessment and plan.   Additional significant findings :  none   Exam: Kennon PortelaKim Gardner assistant Filed Vitals:   09/08/15 0842  BP: 116/70  Height: 5\' 4"  (1.626 m)  Weight: 142 lb (64.411 kg)   General appearance:  Normal affect, orientation and appearance. Skin: Grossly normal HEENT: Without gross lesions.  No cervical or supraclavicular adenopathy. Thyroid normal.  Lungs:  Clear without wheezing, rales or rhonchi Cardiac: RR, without RMG Abdominal:  Soft, nontender, without masses, guarding, rebound, organomegaly or hernia Breasts:  Examined lying and sitting without masses, retractions, discharge or axillary adenopathy. Pelvic:  Ext/BUS/vagina with mild atrophic changes  Cervix with mild atrophic changes  Uterus anteverted, normal size, shape and contour, midline and mobile nontender   Adnexa  Without masses or tenderness    Anus and perineum  Normal   Rectovaginal  Normal sphincter tone without palpated masses or tenderness.    Assessment/Plan:  60 y.o. 33P3003 female for annual exam.   1. Postmenopausal/mild atrophic changes. Without significant hot flashes, night sweats, vaginal dryness or any vaginal bleeding. Continue to monitor report any issues or vaginal bleeding. 2. Occasional insomnia. Primary physician at prescribed her trazodone but this doesn't seem to help her. Had used Ambien in the past with good success. Ambien 10 mg #30 with 2 refills provided. 3. Pap smear/HPV 2015 negative. No Pap smear done today. No history of abnormal Pap smears previously. Plan repeat Pap smear 3-5 year interval. 4. Mammography 07/2015. Continue  with annual mammography. SBE monthly reviewed. 5. Colonoscopy 2010 with planned repeat interval 10 years. 6. DEXA 2013 normal. Plan repeat at 5-10 year interval. Increased calcium vitamin D reviewed. 7. Health maintenance. No routine lab work done as patient has established care with a primary physician and is having this done through their office. Follow up 1 year, sooner as needed.   Dara LordsFONTAINE,Adolf Ormiston P MD, 9:04 AM 09/08/2015

## 2015-09-08 NOTE — Patient Instructions (Signed)

## 2015-09-09 LAB — URINALYSIS W MICROSCOPIC + REFLEX CULTURE
BILIRUBIN URINE: NEGATIVE
Casts: NONE SEEN [LPF]
GLUCOSE, UA: NEGATIVE
HGB URINE DIPSTICK: NEGATIVE
Leukocytes, UA: NEGATIVE
Nitrite: NEGATIVE
PH: 6.5 (ref 5.0–8.0)
PROTEIN: NEGATIVE
Specific Gravity, Urine: 1.023 (ref 1.001–1.035)
YEAST: NONE SEEN [HPF]

## 2015-09-12 ENCOUNTER — Other Ambulatory Visit: Payer: Self-pay | Admitting: Gynecology

## 2015-09-12 MED ORDER — CIPROFLOXACIN HCL 250 MG PO TABS: 250.0000 mg | ORAL_TABLET | Freq: Two times a day (BID) | ORAL | Status: DC

## 2015-09-13 LAB — URINE CULTURE: Colony Count: >=100000

## 2016-06-10 ENCOUNTER — Other Ambulatory Visit: Payer: Self-pay | Admitting: Gynecology

## 2016-06-10 NOTE — Telephone Encounter (Signed)
Called into pharmacy

## 2016-07-15 ENCOUNTER — Encounter: Payer: Self-pay | Admitting: Gynecology

## 2017-01-13 ENCOUNTER — Other Ambulatory Visit: Payer: Self-pay | Admitting: Family Medicine

## 2017-01-13 ENCOUNTER — Ambulatory Visit
Admission: RE | Admit: 2017-01-13 | Discharge: 2017-01-13 | Disposition: A | Discharge status: Home / Self Care | Payer: 59 | Source: Ambulatory Visit | Attending: Family Medicine | Admitting: Family Medicine

## 2017-01-13 DIAGNOSIS — R059 Cough, unspecified: Secondary | ICD-10-CM

## 2017-01-13 DIAGNOSIS — R05 Cough: Secondary | ICD-10-CM

## 2017-02-11 ENCOUNTER — Other Ambulatory Visit: Payer: Self-pay | Admitting: Family Medicine

## 2017-02-11 ENCOUNTER — Ambulatory Visit
Admission: RE | Admit: 2017-02-11 | Discharge: 2017-02-11 | Disposition: A | Discharge status: Home / Self Care | Payer: 59 | Source: Ambulatory Visit | Attending: Family Medicine | Admitting: Family Medicine

## 2017-02-11 DIAGNOSIS — R059 Cough, unspecified: Secondary | ICD-10-CM

## 2017-02-11 DIAGNOSIS — R0602 Shortness of breath: Secondary | ICD-10-CM

## 2017-02-11 DIAGNOSIS — R52 Pain, unspecified: Secondary | ICD-10-CM

## 2017-02-11 DIAGNOSIS — R05 Cough: Secondary | ICD-10-CM

## 2017-02-24 ENCOUNTER — Other Ambulatory Visit: Payer: Self-pay | Admitting: Family Medicine

## 2017-02-24 ENCOUNTER — Ambulatory Visit
Admission: RE | Admit: 2017-02-24 | Discharge: 2017-02-24 | Disposition: A | Discharge status: Home / Self Care | Payer: 59 | Source: Ambulatory Visit | Attending: Family Medicine | Admitting: Family Medicine

## 2017-02-24 DIAGNOSIS — J189 Pneumonia, unspecified organism: Secondary | ICD-10-CM

## 2017-04-07 ENCOUNTER — Encounter: Payer: Self-pay | Admitting: Gynecology

## 2017-04-07 ENCOUNTER — Ambulatory Visit (INDEPENDENT_AMBULATORY_CARE_PROVIDER_SITE_OTHER): Payer: Managed Care, Other (non HMO) | Admitting: Gynecology

## 2017-04-07 VITALS — BP 118/76

## 2017-04-07 DIAGNOSIS — L292 Pruritus vulvae: Secondary | ICD-10-CM

## 2017-04-07 MED ORDER — FLUCONAZOLE 150 MG PO TABS: 150.0000 mg | ORAL_TABLET | Freq: Once | ORAL | 0 refills | Status: AC

## 2017-04-07 MED ORDER — CLOBETASOL PROPIONATE 0.05 % EX CREA: TOPICAL_CREAM | CUTANEOUS | 1 refills | Status: DC

## 2017-04-07 NOTE — Patient Instructions (Signed)
Apply the cream at bedtime to the area of irritation. Do not use inside the vagina. Follow up if the irritation continues and we may consider a skin biopsy.

## 2017-04-07 NOTE — Progress Notes (Signed)
    Koren ShiverVirginia H Caruthers Aug 18, 1956 [id number]        61 y.o.  [id number] presents complaining of vulvar irritation. Patient states that her vulva has been itchy for years. Has never discussed this with me. No discharge, odor, urinary symptoms such as frequency dysuria or urgency. No diarrhea constipation. Never noticed anything on self vulvar exam.  Past medical history,surgical history, problem list, medications, allergies, family history and social history were all reviewed and documented in the EPIC chart.  Directed ROS with pertinent positives and negatives documented in the history of present illness/assessment and plan.  Exam: Kennon PortelaKim Gardner assistant Vitals:   04/07/17 1528  BP: 118/76   General appearance:  Normal Abdomen soft nontender without masses guarding rebound Pelvic external BUS vagina with atrophic changes. No vulvar abnormalities noted such as blanching of the skin, vitiligo changes, erythematous or irritated appearing areas, pigmented or other raised areas. Vagina with atrophic changes. Bimanual without masses or tenderness  Assessment/Plan:  61 y.o. [id number] with irritated vulva historically.  Exam is unremarkable with no visual or palpated abnormalities. She notes primarily on the right labia majora is where she is itchy. No evidence of vaginal infection and wet prep was negative today. Discussed differential to include irritative dermatitis, lichen sclerosis, dysplastic changes, squamous hyperplasia type changes. Given the absence of a physical findings will go ahead and treat as a mildly irritated eczema type issue with clobetasol 0.05 mg cream nightly 4 weeks. If symptoms would persist that I discussed skin biopsy with her and she'll return for that. If symptoms totally resolved and she'll follow expectantly. I did give her 1 Diflucan 150 mg tablet also to treat even though her wet prep is negative will make sure there is not associated yeast.    Dara LordsFONTAINE,Arlisha Patalano P MD, 3:53  PM 04/07/2017

## 2017-04-08 LAB — WET PREP FOR TRICH, YEAST, CLUE
CLUE CELLS WET PREP: NONE SEEN
TRICH WET PREP: NONE SEEN
WBC, Wet Prep HPF POC: NONE SEEN
Yeast Wet Prep HPF POC: NONE SEEN

## 2017-04-08 NOTE — Addendum Note (Signed)
Addended by: Dayna BarkerGARDNER, Gwynn Chalker K on: 04/08/2017 08:41 AM   Modules accepted: Orders

## 2017-04-29 ENCOUNTER — Ambulatory Visit (INDEPENDENT_AMBULATORY_CARE_PROVIDER_SITE_OTHER): Payer: Managed Care, Other (non HMO) | Admitting: Gynecology

## 2017-04-29 ENCOUNTER — Encounter: Payer: Self-pay | Admitting: Gynecology

## 2017-04-29 VITALS — BP 116/74

## 2017-04-29 DIAGNOSIS — N9089 Other specified noninflammatory disorders of vulva and perineum: Secondary | ICD-10-CM | POA: Diagnosis not present

## 2017-04-29 NOTE — Progress Notes (Signed)
    Terri Gallegos 09-09-1955 [id number]        61 y.o.  [id number] presents having recently been seen with vulvar itching. She was treated with clobetasol 0.05% cream and notes that her symptoms resolved. She did notice an area that she showed her husband and he described for raised lesions. Subtotally went to see her primary physician who wanted her to be seen to rule out early cancer. Patient notes that the pruritus she was having was more generalized vulvar and has now resolved with the betazole cream.  Past medical history,surgical history, problem list, medications, allergies, family history and social history were all reviewed and documented in the EPIC chart.  Directed ROS with pertinent positives and negatives documented in the history of present illness/assessment and plan.  Exam: Kennon Portela assistant Vitals:   04/29/17 1402  BP: 116/74   General appearance:  Normal External BUS vagina with atrophic changes. Several small raised classic appearing sebaceous cysts 9:00 position and the perianal region. This is the area the patient is pointing to as to where they noticed the abnormalities. Physical Exam  Genitourinary:      Procedure: The skin overlying and surrounding the small lesions was cleansed with Betadine and the skin was infiltrated with 1% lidocaine. 2 representative areas were excised in their entirety and hemostasis was achieved using silver nitrate.  Assessment/Plan:  61 y.o. G3P3003 with several small raised areas in the perianal region consistent with small sebaceous cyst. No concerning changes to include no ulcerations, pigmentations, plaque-like areas or other VIN type changes. To the larger areas were excised and sent to pathology. Patient will follow up for results. Otherwise assuming they are benign she'll continue to follow and as long as no changes on self exam and she has no further itching then will plan expectant management.    Dara Lords MD,  2:23 PM 04/29/2017

## 2017-04-29 NOTE — Patient Instructions (Addendum)
Office will call you with the biopsy results 

## 2017-05-01 LAB — PATHOLOGY

## 2017-05-02 ENCOUNTER — Telehealth: Payer: Self-pay | Admitting: *Deleted

## 2017-05-02 NOTE — Telephone Encounter (Signed)
Pt aware of results. Pt states she is bleeding as well as swelling at the site where the skin tag was removed. Pt needs advice for treatment.

## 2017-05-02 NOTE — Telephone Encounter (Signed)
-----   Message from Dara Lordsimothy P Fontaine, MD sent at 05/01/2017  4:40 PM EDT ----- Tell patient the biopsy was consistent with a small skin cyst. Totally benign

## 2017-05-02 NOTE — Telephone Encounter (Signed)
Recommend compress with ice and pressure should help with the swelling and oozing

## 2017-05-02 NOTE — Telephone Encounter (Signed)
Pt aware of instructions no questions or concerns

## 2017-06-17 ENCOUNTER — Telehealth: Payer: Self-pay

## 2017-06-17 NOTE — Telephone Encounter (Signed)
SENT NOTES TO SCHEDULING 

## 2017-07-31 ENCOUNTER — Ambulatory Visit (INDEPENDENT_AMBULATORY_CARE_PROVIDER_SITE_OTHER): Payer: Managed Care, Other (non HMO) | Admitting: Pulmonary Disease

## 2017-07-31 VITALS — BP 128/80 | HR 93 | Ht 64.0 in | Wt 162.0 lb

## 2017-07-31 DIAGNOSIS — K219 Gastro-esophageal reflux disease without esophagitis: Secondary | ICD-10-CM

## 2017-07-31 DIAGNOSIS — J301 Allergic rhinitis due to pollen: Secondary | ICD-10-CM

## 2017-07-31 DIAGNOSIS — M7050 Other bursitis of knee, unspecified knee: Secondary | ICD-10-CM | POA: Diagnosis not present

## 2017-07-31 DIAGNOSIS — R0600 Dyspnea, unspecified: Secondary | ICD-10-CM | POA: Diagnosis not present

## 2017-07-31 DIAGNOSIS — R05 Cough: Secondary | ICD-10-CM

## 2017-07-31 DIAGNOSIS — J411 Mucopurulent chronic bronchitis: Secondary | ICD-10-CM | POA: Diagnosis not present

## 2017-07-31 DIAGNOSIS — R059 Cough, unspecified: Secondary | ICD-10-CM

## 2017-07-31 NOTE — Patient Instructions (Signed)
GERD: Take Pepcid over-the-counter twice a day Follow the gastroesophageal lifestyle modification she we gave you.  Allergic rhinitis: Use Neil Med rinses with distilled water at least twice per day using the instructions on the package. 1/2 hour after using the [facility], use Nasacort two puffs in each nostril once per day.  Remember that the Nasacort can take 1-2 weeks to work after regular use. Use generic zyrtec (cetirizine) every day.  If this doesn't help, then stop taking it and use chlorpheniramine-phenylephrine combination tablets.  Recurrent bronchitis: I want to make sure there is not evidence of an underlying lung disease so we will get a lung function test and a high-resolution CT scan of the chest to make sure there is no lung damage, specifically a condition called bronchiectasis  Peds anserine bursitis: I think this is the cause of your right knee pain and swelling.  I recommend that you see a sports medicine doctor like Dr. Ayesha MohairZack Smith or Dr. Gaspar BiddingMichael Rigby to consider an evaluation of the knee and likely injection  You back in 2-4 weeks or sooner if needed

## 2017-07-31 NOTE — Progress Notes (Signed)
Subjective:   PATIENT ID: Terri Gallegos GENDER: female DOB: 27-Jan-1956, MRN: [id number]  Synopsis: Referred in November 2018 for Cough  HPI  Chief Complaint  Patient presents with  . Advice Only    consult by Dr Hyman Hopes, cough started in March, had pna in early June, cough started back again,last night, with clear, thick sputum, has been having headaches, but none this week, has sob at times, and gets out of breathe, and has gained 30 lbs this year,    Terri Gallegos is here to see me for cough: > she has struggled with seasonal allergies that caused cough in the spring and fall, this started at age 61 > this was initially worse as a child, then she go over it > however int he last two years she has had severe coughing bouts in the spring and fall that last for months > this year she had pneumonia in march and then later in the summer she had bronchitis > she says that her cough has ben worse in the last few weeks > she has been coughing up lots of mucus lately which is thick and "slimy" > she says that consistently for the last few years she has been sick at least twice per year > sine 2016 she has required multiple rounds of antibiotics  No childhood respiratory illnesses, smoked briefly 2 weeks age 61.  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  She has five grandchildren under age 58 and is frequently exposed to sick kids.    She notes worsening acid reflux lately > she had been on Nexium but stopped a few years ago > she has gained 30 pounds in the last year  She says that she has been feeling more shortness of breath lately and has been having dizziness with exertion and dyspnea.  She also notes a tenderness in the right chest when she touches.    Right knee swelling is been present for a year with some tenderness on the medial aspect.  This limits her walking.  Past Medical History:  Diagnosis Date  . Allergic rhinitis    . Anxiety   . GERD (gastroesophageal reflux disease)   . Iron deficiency anemia      Family History  Problem Relation Age of Onset  . Hypertension Mother   . Dementia Mother   . Cancer Father        ESOPHAGEAL  . Diabetes Maternal Grandfather      Social History   Socioeconomic History  . Marital status: Married    Spouse name: Not on file  . Number of children: Not on file  . Years of education: Not on file  . Highest education level: Not on file  Social Needs  . Financial resource strain: Not on file  . Food insecurity - worry: Not on file  . Food insecurity - inability: Not on file  . Transportation needs - medical: Not on file  . Transportation needs - non-medical: Not on file  Occupational History  . Not on file  Tobacco Use  . Smoking status: Passive Smoke Exposure - Never Smoker  . Smokeless tobacco: Never Used  . Tobacco comment: exposed by Husband and Father  Substance and Sexual Activity  . Alcohol use: No    Alcohol/week: 0.0 oz  . Drug use: No  . Sexual activity: Yes    Birth control/protection: Post-menopausal    Comment:  1st intercourse 61 yo-Fewer than 5 partners  Other Topics Concern  . Not on file  Social History Narrative  . Not on file     Allergies  Allergen Reactions  . Prednisone Nausea Only     Outpatient Medications Prior to Visit  Medication Sig Dispense Refill  . Calcium Carbonate-Vitamin D (CALCIUM + D PO) Take by mouth.    . clobetasol cream (TEMOVATE) 0.05 % Apply to irritated area at bedtime 30 g 1  . metroNIDAZOLE (METROCREAM) 0.75 % cream Apply topically 2 (two) times daily. 45 g 2  . venlafaxine (EFFEXOR) 50 MG tablet Take 1 tablet (50 mg total) by mouth 2 (two) times daily. (Patient taking differently: Take 50 mg by mouth 3 (three) times daily with meals. ) 60 tablet 11  . doxycycline (DORYX) 100 MG EC tablet Take 100 mg by mouth 2 (two) times daily.    . traZODone (DESYREL) 50 MG tablet Take 50 mg by mouth at bedtime.       No facility-administered medications prior to visit.     Review of Systems  Constitutional: Negative for fever and weight loss.  HENT: Positive for congestion. Negative for ear pain, nosebleeds and sore throat.   Eyes: Negative for redness.  Respiratory: Positive for cough and shortness of breath. Negative for wheezing.   Cardiovascular: Negative for palpitations, leg swelling and PND.  Gastrointestinal: Negative for nausea and vomiting.  Genitourinary: Negative for dysuria.  Skin: Negative for rash.  Neurological: Positive for headaches.  Endo/Heme/Allergies: Does not bruise/bleed easily.  Psychiatric/Behavioral: Negative for depression. The patient is not nervous/anxious.       Objective:  Physical Exam   Vitals:   07/31/17 1023  BP: 128/80  Pulse: 93  SpO2: 94%  Weight: 162 lb (73.5 kg)  Height: 5\' 4"  (1.626 m)    Gen: well appearing, no acute distress HENT: NCAT, OP clear, neck supple without masses Eyes: PERRL, EOMi Lymph: no cervical lymphadenopathy PULM: Few crackles R base. CV: RRR, no mgr, no JVD GI: BS+, soft, nontender, no hsm Derm: no rash or skin breakdown MSK: swelling and tenderness of her medial R knee bursa (over tibia).  normal bulk and tone Neuro: A&Ox4, CN II-XII intact, strength 5/5 in all 4 extremities Psyche: normal mood and affect   CBC    Component Value Date/Time   WBC 5.3 08/18/2014 1232   RBC 4.57 08/18/2014 1232   HGB 14.1 08/18/2014 1232   HCT 40.0 08/18/2014 1232   PLT 252 08/18/2014 1232   MCV 87.5 08/18/2014 1232   MCH 30.9 08/18/2014 1232   MCHC 35.3 08/18/2014 1232   RDW 12.7 08/18/2014 1232   LYMPHSABS 1.7 08/18/2014 1232   MONOABS 0.3 08/18/2014 1232   EOSABS 0.3 08/18/2014 1232   BASOSABS 0.0 08/18/2014 1232     Chest imaging: 02/11/2017 CXR showed 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 Hilar calcification 02/24/2017 CXR images reviewed: normal pulmonary parenchyma, no  infiltrate  PFT:  Labs:  Path:  Echo:  Heart Catheterization:       Assessment & Plan:   Dyspnea, unspecified type - Plan: Pulmonary function test, CT Chest High Resolution  Gastroesophageal reflux disease, esophagitis presence not specified  Allergic rhinitis due to pollen, unspecified seasonality  Cough  Mucopurulent chronic bronchitis (HCC)  Pes anserine bursitis  Discussion: Terri JupiterDale presents today for evaluation of recurrent cough.  This is associated with poorly controlled acid reflux and allergic rhinitis.  However, beyond this there are some more worrisome symptoms  which bother me.  Specifically she has had recurrent bronchitis over the last several years which is becoming increasingly severe.  This always requires antibiotics.  This makes me concerned about a condition like bronchiectasis.  She also has been experiencing more shortness of breath and dizziness which again could be due to an underlying lung disease like bronchiectasis the differential diagnosis is broad and includes cardiac conditions as well.  I think the best approach moving forward is to assess for an underlying lung disease with lung function testing and a high-resolution CT scan of the chest.  This further, we will ask her to treat her gastroesophageal reflux disease and allergic rhinitis better.  She desperately needs to try to lose weight.  She is limited in this in some degree because of knee pain she has been experiencing.  Based on her physical exam I think she has pes anserine bursitis.  She may need to see a sports medicine doctor to assess this further.  GERD: Take Pepcid over-the-counter twice a day Follow the gastroesophageal lifestyle modification she we gave you.  Allergic rhinitis: Use Neil Med rinses with distilled water at least twice per day using the instructions on the package. 1/2 hour after using the [facility] rinse, use Nasacort two puffs in each nostril once per day.  Remember that  the Nasacort can take 1-2 weeks to work after regular use. Use generic zyrtec (cetirizine) every day.  If this doesn't help, then stop taking it and use chlorpheniramine-phenylephrine combination tablets.  Recurrent bronchitis: I want to make sure there is not evidence of an underlying lung disease so we will get a lung function test and a high-resolution CT scan of the chest to make sure there is no lung damage, specifically a condition called bronchiectasis  Pes anserine bursitis: I think this is the cause of your right knee pain and swelling.  I recommend that you see a sports medicine doctor like Dr. Ayesha MohairZack Smith or Dr. Gaspar BiddingMichael Rigby to consider an evaluation of the knee and likely injection  You back in 2-4 weeks or sooner if needed   Current Outpatient Medications:  .  Calcium Carbonate-Vitamin D (CALCIUM + D PO), Take by mouth., Disp: , Rfl:  .  clobetasol cream (TEMOVATE) 0.05 %, Apply to irritated area at bedtime, Disp: 30 g, Rfl: 1 .  metroNIDAZOLE (METROCREAM) 0.75 % cream, Apply topically 2 (two) times daily., Disp: 45 g, Rfl: 2 .  venlafaxine (EFFEXOR) 50 MG tablet, Take 1 tablet (50 mg total) by mouth 2 (two) times daily. (Patient taking differently: Take 50 mg by mouth 3 (three) times daily with meals. ), Disp: 60 tablet, Rfl: 11

## 2017-08-01 ENCOUNTER — Ambulatory Visit (INDEPENDENT_AMBULATORY_CARE_PROVIDER_SITE_OTHER): Payer: Managed Care, Other (non HMO) | Admitting: Pulmonary Disease

## 2017-08-01 DIAGNOSIS — R0600 Dyspnea, unspecified: Secondary | ICD-10-CM | POA: Diagnosis not present

## 2017-08-01 LAB — PULMONARY FUNCTION TEST
DL/VA % pred: 86 %
DL/VA: 4.17 ml/min/mmHg/L
DLCO COR: 19.12 ml/min/mmHg
DLCO cor % pred: 78 %
DLCO unc % pred: 78 %
DLCO unc: 19.01 ml/min/mmHg
FEF 25-75 Post: 4.09 L/sec
FEF 25-75 Pre: 3.61 L/sec
FEF2575-%Change-Post: 13 %
FEF2575-%PRED-PRE: 158 %
FEF2575-%Pred-Post: 180 %
FEV1-%CHANGE-POST: 2 %
FEV1-%PRED-PRE: 100 %
FEV1-%Pred-Post: 103 %
FEV1-Post: 2.6 L
FEV1-Pre: 2.53 L
FEV1FVC-%CHANGE-POST: 0 %
FEV1FVC-%Pred-Pre: 113 %
FEV6-%Change-Post: 1 %
FEV6-%PRED-PRE: 91 %
FEV6-%Pred-Post: 93 %
FEV6-PRE: 2.87 L
FEV6-Post: 2.92 L
FEV6FVC-%PRED-PRE: 103 %
FEV6FVC-%Pred-Post: 103 %
FVC-%CHANGE-POST: 1 %
FVC-%PRED-POST: 89 %
FVC-%PRED-PRE: 88 %
FVC-POST: 2.92 L
FVC-PRE: 2.87 L
POST FEV1/FVC RATIO: 89 %
POST FEV6/FVC RATIO: 100 %
PRE FEV6/FVC RATIO: 100 %
Pre FEV1/FVC ratio: 88 %
RV % PRED: 116 %
RV: 2.34 L
TLC % PRED: 106 %
TLC: 5.4 L

## 2017-08-01 NOTE — Progress Notes (Signed)
PFT done today. 

## 2017-08-05 ENCOUNTER — Telehealth: Payer: Self-pay | Admitting: *Deleted

## 2017-08-05 DIAGNOSIS — Z13220 Encounter for screening for lipoid disorders: Secondary | ICD-10-CM

## 2017-08-05 DIAGNOSIS — Z1321 Encounter for screening for nutritional disorder: Secondary | ICD-10-CM

## 2017-08-05 DIAGNOSIS — Z1329 Encounter for screening for other suspected endocrine disorder: Secondary | ICD-10-CM

## 2017-08-05 DIAGNOSIS — Z01419 Encounter for gynecological examination (general) (routine) without abnormal findings: Secondary | ICD-10-CM

## 2017-08-05 NOTE — Telephone Encounter (Signed)
Okay for CBC, comprehensive metabolic panel, lipid profile, TSH, vitamin D and urine analysis

## 2017-08-05 NOTE — Telephone Encounter (Signed)
Patient scheduled for annual exam on 08/12/17, would like to have labs done prior to exam. Please advise

## 2017-08-06 NOTE — Telephone Encounter (Signed)
Pt coming 08/11/17 for labs, orders placed.

## 2017-08-07 ENCOUNTER — Ambulatory Visit (INDEPENDENT_AMBULATORY_CARE_PROVIDER_SITE_OTHER)
Admission: RE | Admit: 2017-08-07 | Discharge: 2017-08-07 | Disposition: A | Discharge status: Home / Self Care | Payer: Managed Care, Other (non HMO) | Source: Ambulatory Visit | Attending: Pulmonary Disease | Admitting: Pulmonary Disease

## 2017-08-07 DIAGNOSIS — R0600 Dyspnea, unspecified: Secondary | ICD-10-CM | POA: Diagnosis not present

## 2017-08-11 ENCOUNTER — Other Ambulatory Visit: Payer: Managed Care, Other (non HMO)

## 2017-08-12 ENCOUNTER — Encounter: Payer: Self-pay | Admitting: Gynecology

## 2017-08-12 ENCOUNTER — Ambulatory Visit (INDEPENDENT_AMBULATORY_CARE_PROVIDER_SITE_OTHER): Payer: Managed Care, Other (non HMO) | Admitting: Gynecology

## 2017-08-12 VITALS — BP 120/76 | Ht 63.5 in | Wt 157.0 lb

## 2017-08-12 DIAGNOSIS — Z01411 Encounter for gynecological examination (general) (routine) with abnormal findings: Secondary | ICD-10-CM | POA: Diagnosis not present

## 2017-08-12 DIAGNOSIS — N952 Postmenopausal atrophic vaginitis: Secondary | ICD-10-CM | POA: Diagnosis not present

## 2017-08-12 MED ORDER — VENLAFAXINE HCL ER 75 MG PO CP24: 75.0000 mg | ORAL_CAPSULE | Freq: Every day | ORAL | 2 refills | Status: DC

## 2017-08-12 NOTE — Progress Notes (Signed)
    Terri ShiverVirginia H Gallegos 1956/03/07 [id number]        61 y.o.  [id number] for annual gynecologic exam.   Past medical history,surgical history, problem list, medications, allergies, family history and social history were all reviewed and documented as reviewed in the EPIC chart.  ROS:  Performed with pertinent positives and negatives included in the history, assessment and plan.   Additional significant findings : None   Exam: Kennon PortelaKim Gardner assistant Vitals:   08/12/17 1534  BP: 120/76  Weight: 157 lb (71.2 kg)  Height: 5' 3.5" (1.613 m)   Body mass index is 27.38 kg/m.  General appearance:  Normal affect, orientation and appearance. Skin: Grossly normal HEENT: Without gross lesions.  No cervical or supraclavicular adenopathy. Thyroid normal.  Lungs:  Clear without wheezing, rales or rhonchi Cardiac: RR, without RMG Abdominal:  Soft, nontender, without masses, guarding, rebound, organomegaly or hernia Breasts:  Examined lying and sitting without masses, retractions, discharge or axillary adenopathy. Pelvic:  Ext, BUS, Vagina: With atrophic changes  Cervix: With atrophic changes  Uterus: Anteverted, normal size, shape and contour, midline and mobile nontender   Adnexa: Without masses or tenderness    Anus and perineum: Normal   Rectovaginal: Normal sphincter tone without palpated masses or tenderness.    Assessment/Plan:  61 y.o. [id number] female for annual gynecologic exam.   1. Postmenopausal/atrophic genital changes.  No significant hot flushes, night sweats, vaginal dryness or any bleeding.  Continue to monitor and report any issues or bleeding.  Using Effexor 150 mg nightly initially started for hot flushes.  Will decrease to 75 mg now for several months and see how she does.  If does well then will continue to wean and discontinue.  She will call me in several months to let me know how she is doing on the lower dose.  She will call sooner if any issues. 2. Pap smear/HPV 08/2014.   No Pap smear done today.  No history of abnormal Pap smears.  Plan repeat Pap smear at 5-year interval per current screening guidelines. 3. Colonoscopy 2018.  Repeat at their recommended interval. 4. DEXA 2013 normal.  Plan repeat at age 61.  Increase calcium vitamin D reviewed. 5. Mammography 07/2016.  Reminded patient she is overdue and she agrees to schedule.  Breast exam normal today.  SBE monthly reviewed. 6. Health maintenance.  No routine lab work done as patient does this elsewhere.  Follow-up 1 year, sooner as needed.   Dara Lordsimothy P Fontaine MD, 4:05 PM 08/12/2017

## 2017-08-12 NOTE — Patient Instructions (Signed)
Schedule your mammogram.  Follow-up in 1 year for annual exam. 

## 2017-08-13 NOTE — Progress Notes (Signed)
LMOMTCB x 1 

## 2017-08-14 ENCOUNTER — Telehealth: Payer: Self-pay | Admitting: Pulmonary Disease

## 2017-08-14 NOTE — Telephone Encounter (Signed)
Result Notes for CT Chest High Resolution   Notes recorded by Lowell BoutonJones, Jessica E, CMA on 08/13/2017 at 4:52 PM EST LMOM TCB x1. ------  Notes recorded by Lupita LeashMcQuaid, Douglas B, MD on 08/11/2017 at 3:37 PM EST A, Please let the patient know this was OK Thanks, B   Spoke with patient. She is aware of results. Nothing else needed at time of call.

## 2017-08-18 ENCOUNTER — Other Ambulatory Visit: Payer: Managed Care, Other (non HMO)

## 2017-08-19 ENCOUNTER — Other Ambulatory Visit: Payer: Managed Care, Other (non HMO)

## 2017-08-21 ENCOUNTER — Ambulatory Visit (INDEPENDENT_AMBULATORY_CARE_PROVIDER_SITE_OTHER): Payer: Managed Care, Other (non HMO) | Admitting: Pulmonary Disease

## 2017-08-21 ENCOUNTER — Telehealth: Payer: Self-pay | Admitting: *Deleted

## 2017-08-21 ENCOUNTER — Telehealth: Payer: Self-pay | Admitting: Pulmonary Disease

## 2017-08-21 ENCOUNTER — Encounter: Payer: Self-pay | Admitting: Pulmonary Disease

## 2017-08-21 ENCOUNTER — Other Ambulatory Visit: Payer: Managed Care, Other (non HMO)

## 2017-08-21 VITALS — BP 124/76 | HR 96 | Ht 64.0 in | Wt 153.0 lb

## 2017-08-21 DIAGNOSIS — Z1321 Encounter for screening for nutritional disorder: Secondary | ICD-10-CM

## 2017-08-21 DIAGNOSIS — Z01419 Encounter for gynecological examination (general) (routine) without abnormal findings: Secondary | ICD-10-CM

## 2017-08-21 DIAGNOSIS — J329 Chronic sinusitis, unspecified: Secondary | ICD-10-CM | POA: Diagnosis not present

## 2017-08-21 DIAGNOSIS — Z13220 Encounter for screening for lipoid disorders: Secondary | ICD-10-CM

## 2017-08-21 DIAGNOSIS — R0982 Postnasal drip: Secondary | ICD-10-CM

## 2017-08-21 DIAGNOSIS — R0602 Shortness of breath: Secondary | ICD-10-CM

## 2017-08-21 DIAGNOSIS — Z1329 Encounter for screening for other suspected endocrine disorder: Secondary | ICD-10-CM

## 2017-08-21 MED ORDER — LEVOFLOXACIN 500 MG PO TABS: 500.0000 mg | ORAL_TABLET | Freq: Every day | ORAL | 0 refills | Status: DC

## 2017-08-21 NOTE — Telephone Encounter (Addendum)
Spoke with Victorino DikeJennifer with [facility]. Victorino DikeJennifer states igg 1,2,3,4 can not be added on, as this lab is resulted as [facility]. Victorino DikeJennifer states [facility] can only draw [facility]. I have spoken with pt and made her aware. Pt states she will come by our office tomorrow to have labs.  Will route to BQ to make aware.

## 2017-08-21 NOTE — Telephone Encounter (Signed)
Okay to add to blood work

## 2017-08-21 NOTE — Telephone Encounter (Signed)
unable to order test now due to the lab order results to [facility] and we need to have it done with [facility]. I called Margie and informed her with this information. She will contact pt to schedule lab appointment.

## 2017-08-21 NOTE — Progress Notes (Signed)
Subjective:   PATIENT ID: Terri ShiverVirginia H Gallegos GENDER: female DOB: [birth year], MRN: [id number]  Synopsis: Referred in November 2018 for Cough due to allergic rhinitis and GERD  HPI  Chief Complaint  Patient presents with  . Follow-up    3wk rov- recent sinus infection. pt states breathing is baseline. pt reports of prod cough with green to yellow, nasal drainage with green to yellow mixed with bright red blood.    She had a sinus infection 2 weeks ago.  She saw the doctor about a week into her symptoms.  Sh ehas been blowing blood out of her nose.  She started antibiotics a week ao, no improvement in th esinus congestion or dry cough. No chest congestion.  She is using sinus rinses.  Nasacort not helpful.  She says that even though she has been taking the antibiotic she feels like she is getting worse.  She denies shortness of breath.  She has a dry cough which was quite severe after eating a meal last night.  Past Medical History:  Diagnosis Date  . Allergic rhinitis   . Anxiety   . GERD (gastroesophageal reflux disease)   . Iron deficiency anemia   . Pneumonia      Review of Systems  Constitutional: Negative for fever and weight loss.  HENT: Positive for congestion. Negative for ear pain, nosebleeds and sore throat.   Eyes: Negative for redness.  Respiratory: Positive for cough and shortness of breath. Negative for wheezing.   Cardiovascular: Negative for palpitations, leg swelling and PND.  Gastrointestinal: Negative for nausea and vomiting.  Genitourinary: Negative for dysuria.  Skin: Negative for rash.  Neurological: Positive for headaches.  Endo/Heme/Allergies: Does not bruise/bleed easily.  Psychiatric/Behavioral: Negative for depression. The patient is not nervous/anxious.       Objective:  Physical Exam   Vitals:   08/21/17 0931 08/21/17 0937 08/21/17 0938  BP: 124/76 124/76 124/76  Pulse: 96  96  SpO2: 97%  97%  Weight: 153 lb (69.4 kg)  153 lb (69.4 kg)   Height: 5\' 4"  (1.626 m)  5\' 4"  (1.626 m)    Gen: well appearing HENT: OP clear, TM's clear, neck supple PULM: CTA B, normal percussion CV: RRR, no mgr, trace edema GI: BS+, soft, nontender Derm: no cyanosis or rash Psyche: normal mood and affect   CBC    Component Value Date/Time   WBC 5.3 08/18/2014 1232   RBC 4.57 08/18/2014 1232   HGB 14.1 08/18/2014 1232   HCT 40.0 08/18/2014 1232   PLT 252 08/18/2014 1232   MCV 87.5 08/18/2014 1232   MCH 30.9 08/18/2014 1232   MCHC 35.3 08/18/2014 1232   RDW 12.7 08/18/2014 1232   LYMPHSABS 1.7 08/18/2014 1232   MONOABS 0.3 08/18/2014 1232   EOSABS 0.3 08/18/2014 1232   BASOSABS 0.0 08/18/2014 1232     Chest imaging: 02/11/2017 CXR showed 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 Hilar calcification 02/24/2017 CXR images reviewed: normal pulmonary parenchyma, no infiltrate December 2018 high-resolution CT chest images independently reviewed showing borderline upper limit airway caliber in lower lobes, otherwise normal pulmonary parenchyma   PFT: November 2018 ratio 89%, FEV1 2.60 L 103% predicted, FVC 2.92 L 89% predicted, total lung capacity 5.40 L 106% predicted, DLCO 19.01 mL 78% predicted  Labs:  Path:  Echo:  Heart Catheterization:       Assessment & Plan:   No diagnosis found.  Discussion: I see no evidence of an underlying lung disease  based on her normal lung function testing and unremarkable CT scan of the chest.  She may have some very mild bronchiectasis as some of the airways appear to be at the upper limit of normal in terms of caliber but certainly nothing too worrisome.  I think the shortness of breath is due to deconditioning in the setting of recurrent bacterial sinus infections.  She is still having ongoing symptoms of bacterial sinusitis this week despite taking 1 week of doxycycline.  I will change this to Levaquin and order a CT scan of her sinuses.  We will also get IgG subclass testing to look to see if  she is at risk for serial infections from deficiency.  I agree completely with her seeing ear nose and throat.  She has acid reflux and these continue taking acid suppression.  Plan:  Recurrent bacterial sinusitis: I agree that you should see ear nose and throat We will get a CT scan of your sinuses We will try to add on blood work to the blood that was drawn this morning Stop taking doxycycline Start taking Levaquin 500 mg daily for the next 5 days Keep using saline rinses  Gastroesophageal reflux disease: If cough is a persistent problem then I recommend that she take Pepcid daily  Shortness of breath: Your lung function testing was normal, the CT scan only showed very mild dilation of some of the lower lobe airways which may make you prone to bronchitis but should not explain shortness of breath.  I think the best approach is to exercise more frequently once you are better from this acute illness.  We will see you back in late January early February   Current Outpatient Medications:  .  benzonatate (TESSALON) 200 MG capsule, , Disp: , Rfl:  .  Calcium Carbonate-Vitamin D (CALCIUM + D PO), Take by mouth., Disp: , Rfl:  .  Cetirizine HCl (ZYRTEC ALLERGY PO), Take by mouth., Disp: , Rfl:  .  doxycycline (MONODOX) 100 MG capsule, , Disp: , Rfl:  .  fluticasone (FLONASE) 50 MCG/ACT nasal spray, Place into both nostrils daily., Disp: , Rfl:  .  metroNIDAZOLE (METROCREAM) 0.75 % cream, Apply topically 2 (two) times daily., Disp: 45 g, Rfl: 2 .  venlafaxine XR (EFFEXOR-XR) 75 MG 24 hr capsule, Take 1 capsule (75 mg total) by mouth daily with breakfast., Disp: 30 capsule, Rfl: 2

## 2017-08-21 NOTE — Patient Instructions (Signed)
Recurrent bacterial sinusitis: I agree that you should see ear nose and throat We will get a CT scan of your sinuses We will try to add on blood work to the blood that was drawn this morning Stop taking doxycycline Start taking Levaquin 500 mg daily for the next 5 days Keep using saline rinses  Gastroesophageal reflux disease: If cough is a persistent problem then I recommend that she take Pepcid daily  Shortness of breath: Your lung function testing was normal, the CT scan only showed very mild dilation of some of the lower lobe airways which may make you prone to bronchitis but should not explain shortness of breath.  I think the best approach is to exercise more frequently once you are better from this acute illness.  We will see you back in late January early February

## 2017-08-21 NOTE — Addendum Note (Signed)
Addended by: Maxwell MarionBLANKENSHIP, Mataya Kilduff A on: 08/21/2017 03:39 PM   Modules accepted: Orders

## 2017-08-21 NOTE — Telephone Encounter (Signed)
[facility] asked if patient can have IgG 1,2,3, and 4 ran with the blood done at annual labs done today here, Lab said she does have enough blood to add test, it appears the diagnose is sinusitis. Please advise

## 2017-08-22 ENCOUNTER — Ambulatory Visit (INDEPENDENT_AMBULATORY_CARE_PROVIDER_SITE_OTHER)
Admission: RE | Admit: 2017-08-22 | Discharge: 2017-08-22 | Disposition: A | Discharge status: Home / Self Care | Payer: Managed Care, Other (non HMO) | Source: Ambulatory Visit | Attending: Pulmonary Disease | Admitting: Pulmonary Disease

## 2017-08-22 ENCOUNTER — Other Ambulatory Visit: Payer: Managed Care, Other (non HMO)

## 2017-08-22 DIAGNOSIS — J329 Chronic sinusitis, unspecified: Secondary | ICD-10-CM | POA: Diagnosis not present

## 2017-08-22 DIAGNOSIS — R0982 Postnasal drip: Secondary | ICD-10-CM | POA: Diagnosis not present

## 2017-08-22 DIAGNOSIS — R0602 Shortness of breath: Secondary | ICD-10-CM

## 2017-08-22 LAB — COMPREHENSIVE METABOLIC PANEL
AG Ratio: 1.4 (calc) (ref 1.0–2.5)
ALBUMIN MSPROF: 3.9 g/dL (ref 3.6–5.1)
ALT: 58 U/L — ABNORMAL HIGH (ref 6–29)
AST: 30 U/L (ref 10–35)
Alkaline phosphatase (APISO): 138 U/L — ABNORMAL HIGH (ref 33–130)
BUN / CREAT RATIO: 17 (calc) (ref 6–22)
BUN: 17 mg/dL (ref 7–25)
CHLORIDE: 104 mmol/L (ref 98–110)
CO2: 27 mmol/L (ref 20–32)
Calcium: 9.7 mg/dL (ref 8.6–10.4)
Creat: 1.03 mg/dL — ABNORMAL HIGH (ref 0.50–0.99)
GLOBULIN: 2.8 g/dL (ref 1.9–3.7)
GLUCOSE: 98 mg/dL (ref 65–99)
POTASSIUM: 4.5 mmol/L (ref 3.5–5.3)
Sodium: 141 mmol/L (ref 135–146)
Total Bilirubin: 0.4 mg/dL (ref 0.2–1.2)
Total Protein: 6.7 g/dL (ref 6.1–8.1)

## 2017-08-22 LAB — LIPID PANEL
CHOL/HDL RATIO: 4.2 (calc) (ref <5.0)
Cholesterol: 190 mg/dL (ref <200)
HDL: 45 mg/dL — ABNORMAL LOW (ref >50)
LDL Cholesterol (Calc): 115 mg/dL (calc) — ABNORMAL HIGH
Non-HDL Cholesterol (Calc): 145 mg/dL (calc) — ABNORMAL HIGH (ref <130)
Triglycerides: 186 mg/dL — ABNORMAL HIGH (ref <150)

## 2017-08-22 LAB — CBC
HEMATOCRIT: 40.9 % (ref 35.0–45.0)
HEMOGLOBIN: 14 g/dL (ref 11.7–15.5)
MCH: 30.8 pg (ref 27.0–33.0)
MCHC: 34.2 g/dL (ref 32.0–36.0)
MCV: 89.9 fL (ref 80.0–100.0)
MPV: 9.8 fL (ref 7.5–12.5)
Platelets: 389 10*3/uL (ref 140–400)
RBC: 4.55 10*6/uL (ref 3.80–5.10)
RDW: 12.2 % (ref 11.0–15.0)
WBC: 5.4 10*3/uL (ref 3.8–10.8)

## 2017-08-22 LAB — VITAMIN D 25 HYDROXY (VIT D DEFICIENCY, FRACTURES): Vit D, 25-Hydroxy: 34 ng/mL (ref 30–100)

## 2017-08-22 LAB — TSH: TSH: 6.52 mIU/L — ABNORMAL HIGH (ref 0.40–4.50)

## 2017-08-22 NOTE — Addendum Note (Signed)
Addended by: Perlie MayoWILLIAMS, Maximillion Gill L on: 08/22/2017 09:20 AM   Modules accepted: Orders

## 2017-08-25 NOTE — Progress Notes (Signed)
LMTCB

## 2017-08-27 ENCOUNTER — Telehealth: Payer: Self-pay | Admitting: Pulmonary Disease

## 2017-08-27 ENCOUNTER — Other Ambulatory Visit: Payer: Self-pay | Admitting: *Deleted

## 2017-08-27 ENCOUNTER — Encounter: Payer: Self-pay | Admitting: *Deleted

## 2017-08-27 DIAGNOSIS — E7800 Pure hypercholesterolemia, unspecified: Secondary | ICD-10-CM

## 2017-08-27 DIAGNOSIS — R7989 Other specified abnormal findings of blood chemistry: Secondary | ICD-10-CM

## 2017-08-27 DIAGNOSIS — R7401 Elevation of levels of liver transaminase levels: Secondary | ICD-10-CM

## 2017-08-27 DIAGNOSIS — R740 Nonspecific elevation of levels of transaminase and lactic acid dehydrogenase [LDH]: Secondary | ICD-10-CM

## 2017-08-27 LAB — IGG 1, 2, 3, AND 4
IGG, SUBCLASS 2: 348 mg/dL (ref 130–555)
IGG, SUBCLASS 3: 215 mg/dL — AB (ref 15–102)
IgG (Immunoglobin G), Serum: 1045 mg/dL (ref 700–1600)
IgG, Subclass 1: 401 mg/dL (ref 248–810)
IgG, Subclass 4: 23 mg/dL (ref 2–96)

## 2017-08-27 NOTE — Telephone Encounter (Signed)
Notes recorded by Lupita LeashMcQuaid, Douglas B, MD on 08/22/2017 at 12:52 PM EST M, This showed sinusitis. She should keep taking the Levaquin and keep an appointment with ENT> Thanks B  Spoke with patient. She is aware of results. Nothing else needed at time of call.

## 2017-08-29 DIAGNOSIS — K219 Gastro-esophageal reflux disease without esophagitis: Secondary | ICD-10-CM | POA: Insufficient documentation

## 2017-08-29 DIAGNOSIS — J329 Chronic sinusitis, unspecified: Secondary | ICD-10-CM | POA: Insufficient documentation

## 2017-09-03 ENCOUNTER — Telehealth: Payer: Self-pay | Admitting: Pulmonary Disease

## 2017-09-03 NOTE — Telephone Encounter (Signed)
Notes recorded by Lupita LeashMcQuaid, Douglas B, MD on 09/02/2017 at 5:03 PM EST A, Please let the patient know this was OK Thanks, B ------------------------------ Spoke with pt. She is aware of results. Nothing further was needed.

## 2017-10-02 ENCOUNTER — Ambulatory Visit: Payer: Managed Care, Other (non HMO) | Admitting: Pulmonary Disease

## 2017-10-02 ENCOUNTER — Encounter: Payer: Self-pay | Admitting: Pulmonary Disease

## 2017-10-02 ENCOUNTER — Ambulatory Visit (INDEPENDENT_AMBULATORY_CARE_PROVIDER_SITE_OTHER): Payer: Managed Care, Other (non HMO) | Admitting: Pulmonary Disease

## 2017-10-02 VITALS — BP 112/64 | HR 76 | Ht 64.0 in | Wt 151.0 lb

## 2017-10-02 DIAGNOSIS — K219 Gastro-esophageal reflux disease without esophagitis: Secondary | ICD-10-CM | POA: Diagnosis not present

## 2017-10-02 DIAGNOSIS — R0982 Postnasal drip: Secondary | ICD-10-CM | POA: Diagnosis not present

## 2017-10-02 NOTE — Patient Instructions (Signed)
I find no evidence of an underlying lung disease Your blood work to assess your immune system was normal Keep your follow-up appointments with the allergist Let us know if you have any trouble breathing or with your lungs and were happy to see you again

## 2017-10-02 NOTE — Progress Notes (Signed)
Subjective:   PATIENT ID: Terri ShiverVirginia H Gallegos GENDER: female DOB: 1955/10/08, MRN: [id number]  Synopsis: Referred in November 2018 for Cough due to allergic rhinitis and GERD  HPI  Chief Complaint  Patient presents with  . Follow-up    feels like breathing is better,no cough,no SOB   Terri Gallegos says she is doing much better.  She does not have any trouble breathing lately.  No cough.  She started taking montelukast and she says this is been very helpful.  She went to go see Dr. Gary Gallegos and she was told that she had mold allergy.  Past Medical History:  Diagnosis Date  . Allergic rhinitis   . Anxiety   . GERD (gastroesophageal reflux disease)   . Iron deficiency anemia   . Pneumonia      Review of Systems  Constitutional: Negative for fever and weight loss.  HENT: Positive for congestion. Negative for ear pain, nosebleeds and sore throat.   Eyes: Negative for redness.  Respiratory: Positive for cough and shortness of breath. Negative for wheezing.   Cardiovascular: Negative for palpitations, leg swelling and PND.  Gastrointestinal: Negative for nausea and vomiting.  Genitourinary: Negative for dysuria.  Skin: Negative for rash.  Neurological: Positive for headaches.  Endo/Heme/Allergies: Does not bruise/bleed easily.  Psychiatric/Behavioral: Negative for depression. The patient is not nervous/anxious.       Objective:  Physical Exam   Vitals:   10/02/17 1643  BP: 112/64  Pulse: 76  SpO2: 98%  Weight: 151 lb (68.5 kg)  Height: 5\' 4"  (1.626 m)    Gen: well appearing HENT: OP clear, TM's clear, neck supple PULM: CTA B, normal percussion CV: RRR, no mgr, trace edema GI: BS+, soft, nontender Derm: no cyanosis or rash Psyche: normal mood and affect   CBC    Component Value Date/Time   WBC 5.4 08/21/2017 0902   RBC 4.55 08/21/2017 0902   HGB 14.0 08/21/2017 0902   HCT 40.9 08/21/2017 0902   PLT 389 08/21/2017 0902   MCV 89.9 08/21/2017 0902   MCH 30.8  08/21/2017 0902   MCHC 34.2 08/21/2017 0902   RDW 12.2 08/21/2017 0902   LYMPHSABS 1.7 08/18/2014 1232   MONOABS 0.3 08/18/2014 1232   EOSABS 0.3 08/18/2014 1232   BASOSABS 0.0 08/18/2014 1232   Other imaging: December 2018 CT sinuses: Acute rhinosinusitis  Chest imaging: 02/11/2017 CXR showed 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 Hilar calcification 02/24/2017 CXR images reviewed: normal pulmonary parenchyma, no infiltrate December 2018 high-resolution CT chest images independently reviewed showing borderline upper limit airway caliber in lower lobes, otherwise normal pulmonary parenchyma   PFT: November 2018 ratio 89%, FEV1 2.60 L 103% predicted, FVC 2.92 L 89% predicted, total lung capacity 5.40 L 106% predicted, DLCO 19.01 mL 78% predicted  Labs: December 2018 immunoglobulin (IgG) subclass profile normal  Path:  Echo:  Heart Catheterization:       Assessment & Plan:   Postnasal drip  Gastroesophageal reflux disease, esophagitis presence not specified  Discussion: Terri Gallegos has done much better.  She had acute and recurrent episodes of rhinosinusitis in 2018 which I believe was due to poorly controlled allergic rhinitis.  We tested her for immunoglobulin subclass deficiency and this was all normal.  She is done better with the addition of montelukast asked per the direction from the allergy clinic.  Plan: Allergic rhinitis Continue montelukast and nasal steroids as directed by the [facility] We tested your blood and saw no  evidence of an deficiency  We will see you back on an as-needed basis   Current Outpatient Medications:  .  albuterol (PROVENTIL HFA;VENTOLIN HFA) 108 (90 Base) MCG/ACT inhaler, Inhale into the lungs every 6 (six) hours as needed for wheezing or shortness of breath., Disp: , Rfl:  .  Azelastine-Fluticasone (DYMISTA) 137-50 MCG/ACT SUSP, Place into the nose., Disp: , Rfl:  .  benzonatate (TESSALON) 200 MG capsule, , Disp: ,  Rfl:  .  Calcium Carbonate-Vitamin D (CALCIUM + D PO), Take by mouth., Disp: , Rfl:  .  Cetirizine HCl (ZYRTEC ALLERGY PO), Take by mouth., Disp: , Rfl:  .  metroNIDAZOLE (METROCREAM) 0.75 % cream, Apply topically 2 (two) times daily., Disp: 45 g, Rfl: 2 .  montelukast (SINGULAIR) 10 MG tablet, Take 10 mg by mouth at bedtime., Disp: , Rfl:  .  Multiple Vitamin (MULTIVITAMIN) tablet, Take 1 tablet by mouth daily., Disp: , Rfl:  .  venlafaxine XR (EFFEXOR-XR) 75 MG 24 hr capsule, Take 1 capsule (75 mg total) by mouth daily with breakfast., Disp: 30 capsule, Rfl: 2

## 2017-10-08 ENCOUNTER — Other Ambulatory Visit: Payer: Managed Care, Other (non HMO)

## 2017-10-08 DIAGNOSIS — E7800 Pure hypercholesterolemia, unspecified: Secondary | ICD-10-CM

## 2017-10-08 DIAGNOSIS — R7401 Elevation of levels of liver transaminase levels: Secondary | ICD-10-CM

## 2017-10-08 DIAGNOSIS — R740 Nonspecific elevation of levels of transaminase and lactic acid dehydrogenase [LDH]: Secondary | ICD-10-CM

## 2017-10-08 DIAGNOSIS — R7989 Other specified abnormal findings of blood chemistry: Secondary | ICD-10-CM

## 2017-10-08 LAB — LIPID PANEL
CHOL/HDL RATIO: 3.6 (calc) (ref <5.0)
CHOLESTEROL: 246 mg/dL — AB (ref <200)
HDL: 69 mg/dL (ref >50)
LDL CHOLESTEROL (CALC): 150 mg/dL — AB
Non-HDL Cholesterol (Calc): 177 mg/dL (calc) — ABNORMAL HIGH (ref <130)
Triglycerides: 147 mg/dL (ref <150)

## 2017-10-08 LAB — COMPREHENSIVE METABOLIC PANEL
AG Ratio: 1.7 (calc) (ref 1.0–2.5)
ALBUMIN MSPROF: 4.4 g/dL (ref 3.6–5.1)
ALKALINE PHOSPHATASE (APISO): 95 U/L (ref 33–130)
ALT: 48 U/L — ABNORMAL HIGH (ref 6–29)
AST: 34 U/L (ref 10–35)
BUN: 19 mg/dL (ref 7–25)
CO2: 29 mmol/L (ref 20–32)
CREATININE: 0.94 mg/dL (ref 0.50–0.99)
Calcium: 9.9 mg/dL (ref 8.6–10.4)
Chloride: 105 mmol/L (ref 98–110)
Globulin: 2.6 g/dL (calc) (ref 1.9–3.7)
Glucose, Bld: 97 mg/dL (ref 65–99)
POTASSIUM: 4.4 mmol/L (ref 3.5–5.3)
Sodium: 140 mmol/L (ref 135–146)
Total Bilirubin: 0.6 mg/dL (ref 0.2–1.2)
Total Protein: 7 g/dL (ref 6.1–8.1)

## 2017-10-08 LAB — THYROID PANEL WITH TSH
FREE THYROXINE INDEX: 1.8 (ref 1.4–3.8)
T3 Uptake: 27 % (ref 22–35)
T4, Total: 6.6 ug/dL (ref 5.1–11.9)
TSH: 3.82 m[IU]/L (ref 0.40–4.50)

## 2017-10-09 ENCOUNTER — Telehealth: Payer: Self-pay

## 2017-10-09 NOTE — Telephone Encounter (Signed)
[facility].  They had the med circled and note written off from it stating "Patient says her dose has been decreased. Please send in new Rx for lower dose."  I called patient and per DPR access note on file left message reminding her that she had been on 150 mg and at Dec visit Dr. Velvet BatheF decreased her to the 75 mg. I told her the reason he only put a couple of refills on it because he said in the chart he wanted her to call and report how she was doing on it after a few mos.  I asked her to call me.

## 2017-10-09 NOTE — Telephone Encounter (Signed)
Patient called me back. She said she is doing just fine on the generic Effexor 75 mg. She said she thought you were going to decrease her dose at this point to 50mg . Please advise.

## 2017-10-13 ENCOUNTER — Other Ambulatory Visit: Payer: Self-pay | Admitting: Gynecology

## 2017-10-13 DIAGNOSIS — E7800 Pure hypercholesterolemia, unspecified: Secondary | ICD-10-CM

## 2017-10-13 MED ORDER — VENLAFAXINE HCL ER 37.5 MG PO CP24: 37.5000 mg | ORAL_CAPSULE | Freq: Every day | ORAL | 2 refills | Status: DC

## 2017-10-13 NOTE — Telephone Encounter (Signed)
Left message to call.

## 2017-10-13 NOTE — Telephone Encounter (Signed)
Plan was to wean her off of this.  If she has done well with the 75 mg after several months then I would go to 37.5 dose times 3 months then discontinue

## 2017-10-14 NOTE — Telephone Encounter (Signed)
I spoke with patient and advised her. Rx sent.

## 2017-11-17 ENCOUNTER — Encounter: Payer: Self-pay | Admitting: Gynecology

## 2017-11-17 ENCOUNTER — Ambulatory Visit (INDEPENDENT_AMBULATORY_CARE_PROVIDER_SITE_OTHER): Payer: Managed Care, Other (non HMO) | Admitting: Gynecology

## 2017-11-17 VITALS — BP 118/76

## 2017-11-17 DIAGNOSIS — E7800 Pure hypercholesterolemia, unspecified: Secondary | ICD-10-CM

## 2017-11-17 DIAGNOSIS — R4589 Other symptoms and signs involving emotional state: Secondary | ICD-10-CM | POA: Diagnosis not present

## 2017-11-17 DIAGNOSIS — N9089 Other specified noninflammatory disorders of vulva and perineum: Secondary | ICD-10-CM

## 2017-11-17 DIAGNOSIS — N907 Vulvar cyst: Secondary | ICD-10-CM

## 2017-11-17 LAB — WET PREP FOR TRICH, YEAST, CLUE

## 2017-11-17 MED ORDER — VENLAFAXINE HCL ER 75 MG PO CP24: 75.0000 mg | ORAL_CAPSULE | Freq: Every day | ORAL | 6 refills | Status: DC

## 2017-11-17 NOTE — Progress Notes (Signed)
Terri Gallegos [birth year] [id number]        62 y.o.  G3P3003 presents with several issues  1. Several weeks of irritated vulvar bump.  Very itchy and burning when she showers.  No discharge or odor.  No urinary symptoms such as frequency dysuria urgency low back pain fever or chills. 2. Worsening depression.  She is weaning off Effexor Exar for which she was on 150 mg and now 37.5.  Her last several months she has noticed feeling a little more depressed.  Not affecting her daily activities.  No weight changes or sleep disturbances.  Is being worked up for chronic cough and allergies which is starting to get her down. 3. Questions about her lab work.  Had repeat comprehensive metabolic panel for elevated creatinine and LFT, repeat fasting lipid profile due to elevated LDL and triglycerides and a repeat thyroid panel due to elevated TSH.  Was found to be marginal in her vitamin D at 34 and was recommended to start on 1000 units of additional vitamin D daily.  Past medical history,surgical history, problem list, medications, allergies, family history and social history were all reviewed and documented in the EPIC chart.  Directed ROS with pertinent positives and negatives documented in the history of present illness/assessment and plan.  Exam: Kennon PortelaKim Gardner assistant Vitals:   11/17/17 1539  BP: 118/76   General appearance:  Normal Abdomen soft nontender without masses guarding rebound Pelvic external BUS vagina with atrophic changes.  Small sebaceous cyst to the left of the clitoral hood in the skin fold of the labia minora.  Vagina otherwise with atrophic changes but normal.  Cervix atrophic.  Uterus grossly normal midline mobile nontender.  Adnexa without masses or tenderness.  Procedure: The area overlying the small sebaceous cyst was cleansed with Betadine and lanced with a 25-gauge needle.  Sebaceous material extruded completely emptying the cyst.  No bleeding afterwards.  Patient  tolerated well.  Assessment/Plan:  62 y.o. G3P3003 with:  1. Sebaceous cyst of the vulva as described above.  I did a wet prep given the itching history just to make sure and there was no yeast.  She wanted me to drain the area because she feels this is the source of her irritation.  It was drained above consistent with a classic small sebaceous cyst.  Patient will follow-up if her itching/irritation symptoms continue. 2. Worsening depression with tapering of Effexor.  Also feeling down about dealing with her allergies and chronic cough.  She is actively being followed by allergist and pulmonologist.  Recommended at this time she increase to Effexor X are 75 mg we will see how she does with this.  She will call if she has worsening depression or other symptoms. 3. Lab work reviewed.  Reviewed her borderline vitamin D of 34 and she is taking the 1000 units additional vitamin D daily.  We will recheck her vitamin D level next year.  Her TSH was elevated but on repeat thyroid panel was all normal and will follow expectantly.  Her creatinine on repeat was normal and her ALT was decreasing at 48 from 58.  The most significant finding was her cholesterol 246 with the prior cholesterol 190 and an elevated LDL of 150.  Since they were much different than her prior values and she stated in February when they were done she was fasting and eating a healthy diet I recommended she wait 3 months and repeat a fasting lipid profile and if it does  remain elevated then to follow-up with her primary physician for ongoing management.  Greater than 50% of my time was spent in direct face to face counseling and coordination of care with the patient.     Dara Lords MD, 4:06 PM 11/17/2017

## 2017-11-17 NOTE — Addendum Note (Signed)
Addended by: Dayna BarkerGARDNER, Taytum Wheller K on: 11/17/2017 04:29 PM   Modules accepted: Orders

## 2017-11-17 NOTE — Patient Instructions (Signed)
Follow-up for fasting lipid profile in several months

## 2018-03-03 DIAGNOSIS — R200 Anesthesia of skin: Secondary | ICD-10-CM | POA: Insufficient documentation

## 2018-03-03 DIAGNOSIS — M79671 Pain in right foot: Secondary | ICD-10-CM | POA: Insufficient documentation

## 2018-03-03 DIAGNOSIS — M25561 Pain in right knee: Secondary | ICD-10-CM | POA: Insufficient documentation

## 2018-03-06 ENCOUNTER — Encounter: Payer: Self-pay | Admitting: Gynecology

## 2018-04-27 ENCOUNTER — Ambulatory Visit (INDEPENDENT_AMBULATORY_CARE_PROVIDER_SITE_OTHER): Payer: Managed Care, Other (non HMO) | Admitting: Women's Health

## 2018-04-27 ENCOUNTER — Telehealth: Payer: Self-pay | Admitting: *Deleted

## 2018-04-27 ENCOUNTER — Encounter: Payer: Self-pay | Admitting: Women's Health

## 2018-04-27 DIAGNOSIS — R300 Dysuria: Secondary | ICD-10-CM | POA: Diagnosis not present

## 2018-04-27 DIAGNOSIS — N898 Other specified noninflammatory disorders of vagina: Secondary | ICD-10-CM | POA: Diagnosis not present

## 2018-04-27 LAB — WET PREP FOR TRICH, YEAST, CLUE

## 2018-04-27 MED ORDER — PHENAZOPYRIDINE HCL 200 MG PO TABS: 200.0000 mg | ORAL_TABLET | Freq: Three times a day (TID) | ORAL | 0 refills | Status: DC | PRN

## 2018-04-27 MED ORDER — VENLAFAXINE HCL ER 150 MG PO CP24: 150.0000 mg | ORAL_CAPSULE | Freq: Every day | ORAL | 3 refills | Status: DC

## 2018-04-27 NOTE — Telephone Encounter (Signed)
Patient informed, Rx sent.  

## 2018-04-27 NOTE — Progress Notes (Signed)
62 year old MWF G3, P3 presents with complaint of bladder pain that she felt was like an electrical shock intermittently yesterday, none today.  Increased bladder pressure with frequency and urgency without burning yesterday,  Minimal today.  Denies vaginal discharge, visible blood in urine/ vaginally or fever. Postmenopausal on no HRT with no bleeding.  History of acid reflux, asthma, and depression.  Desk job.  No change in routine but had pneumonia vaccine 4 days ago and states does react differently to vaccines and medications.  Exam: Appears well.  No CVAT, abdomen soft without rebound or radiation, no discomfort with bladder  palpation.  Sternal genitalia within normal limits, speculum exam no visible discharge or erythema, wet prep negative.  Bimanual no CMT or adnexal tenderness no tenderness with suprapubic pressure. UA: Negative blood, nitrites and, leukocytes, no WBCs, RBCs or bacteria  Bladder pain x1 day  Plan: Pyridium 200 mg 3 times daily if needed.  Reviewed if this pain would return to call office .  Reviewed  normality of urine, wet prep and exam.

## 2018-04-27 NOTE — Telephone Encounter (Signed)
Okay to go back up to the 150 mg dose.  Refill through next annual exam

## 2018-04-27 NOTE — Telephone Encounter (Signed)
Patient called to follow up has tried to Effexor 75 mg capsule decrease and would like to go back on 150 mg. She the hot flashes are bad and depression with lower dose. Okay to increase? Please advise

## 2018-04-28 LAB — URINALYSIS, COMPLETE W/RFL CULTURE
BILIRUBIN URINE: NEGATIVE
Bacteria, UA: NONE SEEN /HPF
GLUCOSE, UA: NEGATIVE
Hgb urine dipstick: NEGATIVE
Hyaline Cast: NONE SEEN /LPF
Ketones, ur: NEGATIVE
Leukocyte Esterase: NEGATIVE
NITRITES URINE, INITIAL: NEGATIVE
PROTEIN: NEGATIVE
RBC / HPF: NONE SEEN /HPF (ref 0–2)
Specific Gravity, Urine: 1.02 (ref 1.001–1.03)
WBC UA: NONE SEEN /HPF (ref 0–5)
pH: 6 (ref 5.0–8.0)

## 2018-04-28 LAB — NO CULTURE INDICATED

## 2018-08-13 ENCOUNTER — Encounter: Payer: Self-pay | Admitting: Gynecology

## 2018-08-13 ENCOUNTER — Ambulatory Visit (INDEPENDENT_AMBULATORY_CARE_PROVIDER_SITE_OTHER): Payer: Managed Care, Other (non HMO) | Admitting: Gynecology

## 2018-08-13 VITALS — BP 120/76 | Ht 63.5 in | Wt 166.0 lb

## 2018-08-13 DIAGNOSIS — Z01419 Encounter for gynecological examination (general) (routine) without abnormal findings: Secondary | ICD-10-CM | POA: Diagnosis not present

## 2018-08-13 DIAGNOSIS — N952 Postmenopausal atrophic vaginitis: Secondary | ICD-10-CM | POA: Diagnosis not present

## 2018-08-13 MED ORDER — VENLAFAXINE HCL ER 150 MG PO CP24: 150.0000 mg | ORAL_CAPSULE | Freq: Every day | ORAL | 12 refills | Status: DC

## 2018-08-13 NOTE — Progress Notes (Signed)
    Koren ShiverVirginia H Hosley [birth year] [id number]        62 y.o.  [id number] for annual gynecologic exam.  Without gynecologic complaints.  Past medical history,surgical history, problem list, medications, allergies, family history and social history were all reviewed and documented as reviewed in the EPIC chart.  ROS:  Performed with pertinent positives and negatives included in the history, assessment and plan.   Additional significant findings : None   Exam: Kennon PortelaKim Gardner assistant Vitals:   08/13/18 0902  BP: 120/76  Weight: 166 lb (75.3 kg)  Height: 5' 3.5" (1.613 m)   Body mass index is 28.94 kg/m.  General appearance:  Normal affect, orientation and appearance. Skin: Grossly normal HEENT: Without gross lesions.  No cervical or supraclavicular adenopathy. Thyroid normal.  Lungs:  Clear without wheezing, rales or rhonchi Cardiac: RR, without RMG Abdominal:  Soft, nontender, without masses, guarding, rebound, organomegaly or hernia Breasts:  Examined lying and sitting without masses, retractions, discharge or axillary adenopathy. Pelvic:  Ext, BUS, Vagina: With atrophic changes  Cervix: With atrophic changes  Uterus: Anteverted, normal size, shape and contour, midline and mobile nontender   Adnexa: Without masses or tenderness    Anus and perineum: Normal   Rectovaginal: Normal sphincter tone without palpated masses or tenderness.    Assessment/Plan:  62 y.o. 103P3003 female for annual gynecologic exam.   1. Postmenopausal.  No significant menopausal symptoms or any vaginal bleeding.  Using Effexor 150 mg nightly initially started for hot flushes.  Refill x1 year. 2. Pap smear/HPV 08/2014.  No Pap smear done today.  No history of abnormal Pap smears.  Plan repeat Pap smear/HPV next year at 5-year interval per current screening guidelines. 3. Colonoscopy 2018.  Repeat at their recommended interval. 4. DEXA 2013 normal.  Plan repeat DEXA at age 62. 5. Mammography 03/2018.  Continue  with annual mammography when due.  Breast exam normal today. 6. Health maintenance.  We discussed her weight gain over the past several years.  Strategies for weight loss reviewed.  No routine lab work done as patient does this elsewhere.  Follow-up 1 year, sooner as needed.   Dara Lordsimothy P Jerrye Seebeck MD, 9:27 AM 08/13/2018

## 2018-08-13 NOTE — Patient Instructions (Signed)
Follow-up in 1 year for annual exam, sooner as needed. 

## 2018-11-05 IMAGING — CR DG RIBS 2V*R*
2 series · 2 of 2 positions shown · non-contrast
Comparison: Radiography same day

CLINICAL DATA: Cough over the last 10 weeks. Right-sided chest
pain.

EXAM:
RIGHT RIBS - 2 VIEW

[w ribs ap lower right]
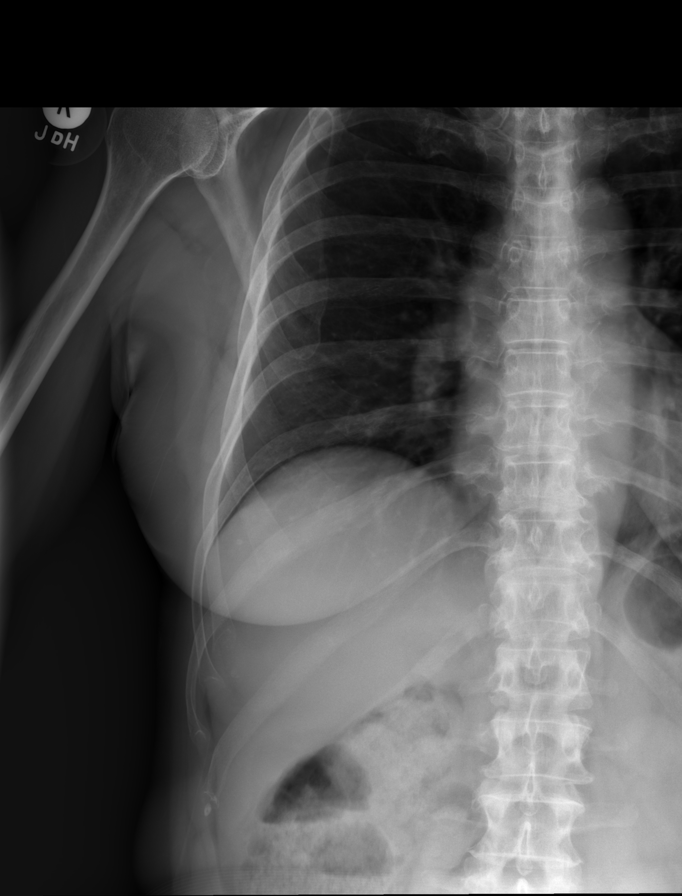

[w ribs obl right]
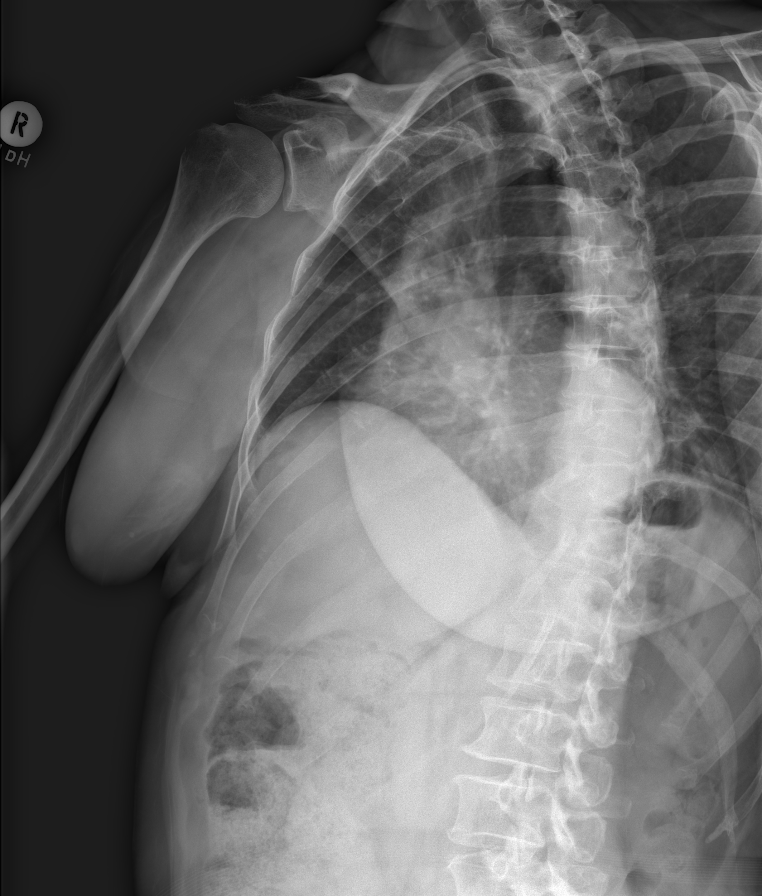

[2 of 2 positions shown; findings below may reference images not displayed]

FINDINGS: No fracture or other bone lesions are seen involving the ribs.
IMPRESSION: Negative.

## 2018-11-05 IMAGING — CR DG CHEST 2V
2 series · 2 of 2 positions shown · non-contrast
Comparison: 01/13/2017

CLINICAL DATA: Cough over the last 10 weeks. Right-sided chest
pain.

EXAM:
CHEST  2 VIEW

[w chest pa]
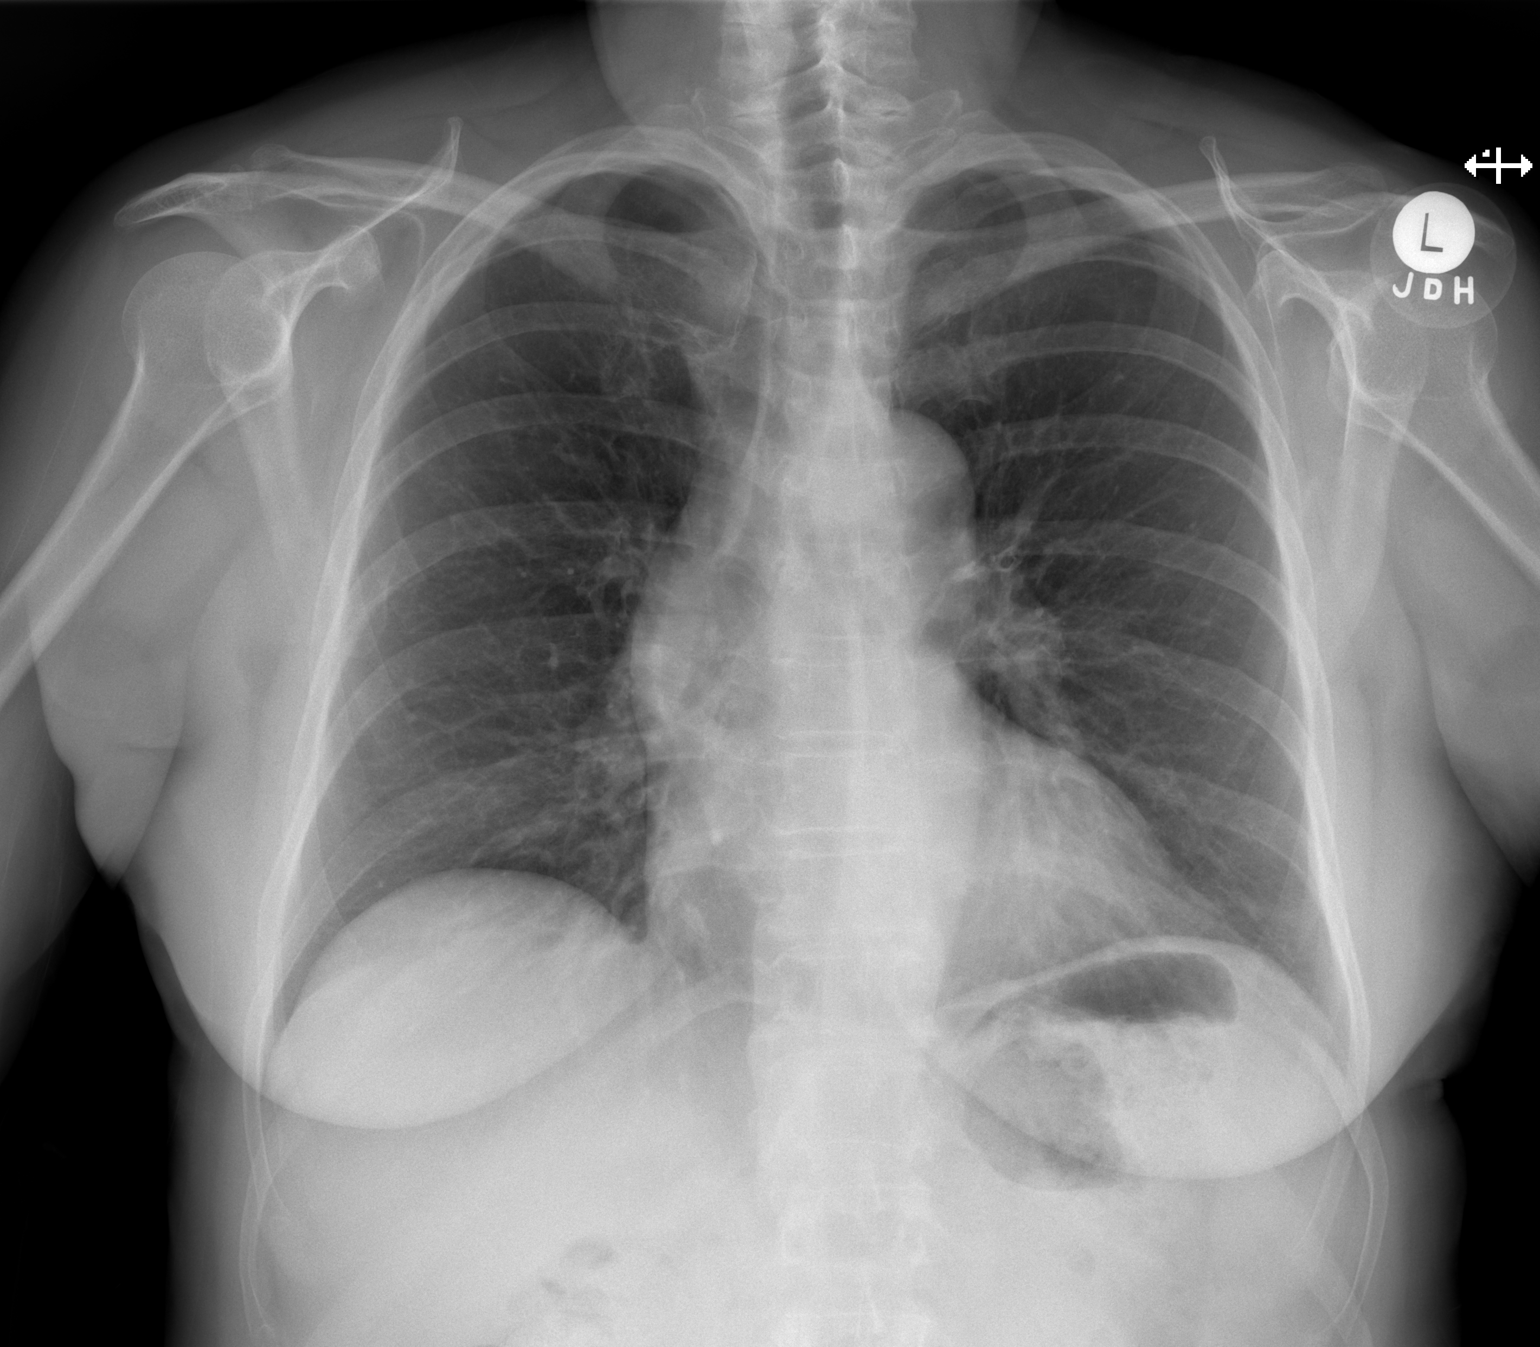

[w chest lat]
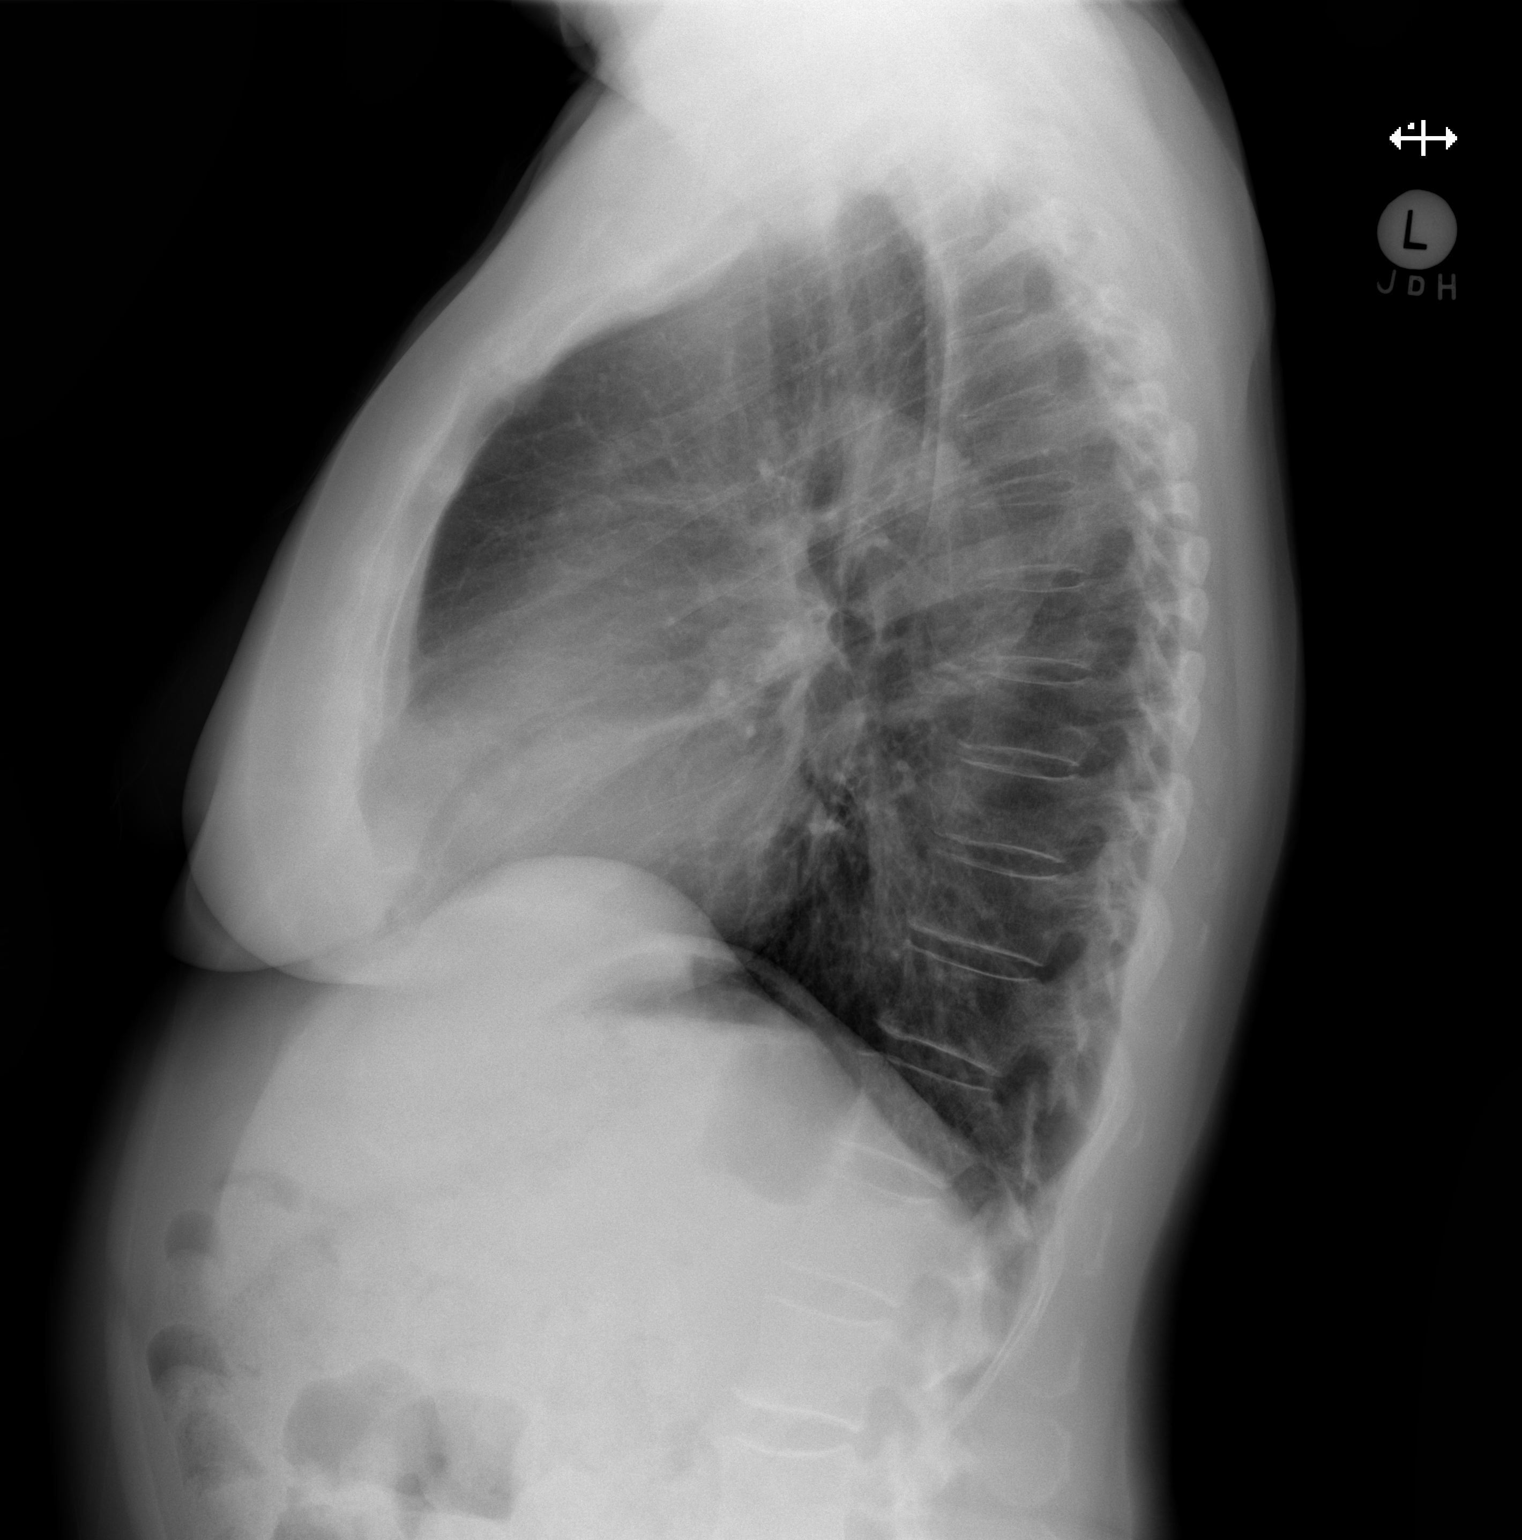

[2 of 2 positions shown; findings below may reference images not displayed]

FINDINGS: Heart size is normal. The aorta is unfolded. There is central
bronchial thickening. I think there is mild patchy infiltrate in the
right middle lobe and lingula. No dense consolidation or lobar
collapse. No effusions. No acute bone finding at chest radiography.
IMPRESSION: Central bronchial thickening. Mild patchy infiltrate in the right
middle lobe and lingula, minimal radiographic findings.

## 2019-06-01 ENCOUNTER — Encounter: Payer: Self-pay | Admitting: Gynecology

## 2019-07-15 ENCOUNTER — Encounter: Payer: Self-pay | Admitting: Neurology

## 2019-08-16 ENCOUNTER — Other Ambulatory Visit: Payer: Self-pay

## 2019-08-17 ENCOUNTER — Ambulatory Visit (INDEPENDENT_AMBULATORY_CARE_PROVIDER_SITE_OTHER): Payer: Managed Care, Other (non HMO) | Admitting: Gynecology

## 2019-08-17 ENCOUNTER — Encounter: Payer: Self-pay | Admitting: Gynecology

## 2019-08-17 VITALS — BP 124/80 | Ht 64.0 in | Wt 156.0 lb

## 2019-08-17 DIAGNOSIS — N952 Postmenopausal atrophic vaginitis: Secondary | ICD-10-CM

## 2019-08-17 DIAGNOSIS — Z1151 Encounter for screening for human papillomavirus (HPV): Secondary | ICD-10-CM | POA: Diagnosis not present

## 2019-08-17 DIAGNOSIS — Z01419 Encounter for gynecological examination (general) (routine) without abnormal findings: Secondary | ICD-10-CM | POA: Diagnosis not present

## 2019-08-17 NOTE — Progress Notes (Signed)
    Terri Gallegos 1955-10-14 [id number]        63 y.o.  [id number] for annual gynecologic exam.  Without gynecologic complaints  Past medical history,surgical history, problem list, medications, allergies, family history and social history were all reviewed and documented as reviewed in the EPIC chart.  ROS:  Performed with pertinent positives and negatives included in the history, assessment and plan.   Additional significant findings : None   Exam: Caryn Bee assistant Vitals:   08/17/19 0840  BP: 124/80  Weight: 156 lb (70.8 kg)  Height: 5\' 4"  (1.626 m)   Body mass index is 26.78 kg/m.  General appearance:  Normal affect, orientation and appearance. Skin: Grossly normal HEENT: Without gross lesions.  No cervical or supraclavicular adenopathy. Thyroid normal.  Lungs:  Clear without wheezing, rales or rhonchi Cardiac: RR, without RMG Abdominal:  Soft, nontender, without masses, guarding, rebound, organomegaly or hernia Breasts:  Examined lying and sitting without masses, retractions, discharge or axillary adenopathy. Pelvic:  Ext, BUS, Vagina: With atrophic changes  Cervix: With atrophic changes  Uterus: Anteverted, normal size, shape and contour, midline and mobile nontender   Adnexa: Without masses or tenderness    Anus and perineum: Normal   Rectovaginal: Normal sphincter tone without palpated masses or tenderness.    Assessment/Plan:  63 y.o. G72P3003 female for annual gynecologic exam.   1. Postmenopausal.  No significant menopausal symptoms or any vaginal bleeding. 2. Pap smear/HPV 2015.  Pap smear/HPV today.  No history of significant abnormal Pap smears. 3. Colonoscopy 2018 with reported repeat interval 5 years. 4. DEXA 2013 normal.  Recommend follow-up DEXA beginning of next year as she is turning 49.  Patient will schedule in follow-up for this 5. Mammography 06/2019.  Continue with annual mammography when due.  Breast exam normal today. 6. Health  maintenance.  No routine lab work done as patient does this elsewhere.  Follow-up 1 year, sooner as needed.   Anastasio Auerbach MD, 9:07 AM 08/17/2019

## 2019-08-17 NOTE — Addendum Note (Signed)
Addended by: Nelva Nay on: 08/17/2019 10:17 AM   Modules accepted: Orders

## 2019-08-17 NOTE — Patient Instructions (Signed)
Follow-up for the bone density as scheduled.  Follow-up in 1 year for annual exam 

## 2019-08-18 LAB — PAP IG AND HPV HIGH-RISK: HPV DNA High Risk: NOT DETECTED

## 2019-08-23 ENCOUNTER — Ambulatory Visit (INDEPENDENT_AMBULATORY_CARE_PROVIDER_SITE_OTHER): Payer: Managed Care, Other (non HMO) | Admitting: Neurology

## 2019-08-23 ENCOUNTER — Other Ambulatory Visit: Payer: Self-pay

## 2019-08-23 ENCOUNTER — Encounter: Payer: Self-pay | Admitting: Neurology

## 2019-08-23 VITALS — BP 132/83 | HR 98 | Ht 64.0 in | Wt 158.0 lb

## 2019-08-23 DIAGNOSIS — R202 Paresthesia of skin: Secondary | ICD-10-CM | POA: Diagnosis not present

## 2019-08-23 DIAGNOSIS — M79601 Pain in right arm: Secondary | ICD-10-CM

## 2019-08-23 DIAGNOSIS — M79602 Pain in left arm: Secondary | ICD-10-CM

## 2019-08-23 DIAGNOSIS — R292 Abnormal reflex: Secondary | ICD-10-CM | POA: Diagnosis not present

## 2019-08-23 NOTE — Patient Instructions (Addendum)
MRI brain will be ordered.  We will contact you with these results once they are available and let you know the next step.

## 2019-08-23 NOTE — Progress Notes (Signed)
[facility] Clinic Note - Initial Visit   Date: 08/23/19  Terri ShiverVirginia H Gallegos MRN: [id number] DOB: October 17, 1955   Dear Dr. Hyman HopesWebb:  Thank you for your kind referral of Terri Gallegos for consultation of numbness/tingling. Although her history is well known to you, please allow us to reiterate it for the purpose of our medical record. The patient was accompanied to the clinic by self.   History of Present Illness: [place] H Melchor Gallegos is a 63 y.o. right-handed female with asthma presenting for evaluation of generalized numbness/tingling.  Starting around 2016, she began having numbness in the right lower leg, which has been constant since onset.  Over the past year, she has noticed numbness/tingling has involved her left leg, both feet, and both hands.  Symptoms were initially intermittent, lasting for a few days up to a week.  However, since November, her symptoms have been constant and worse in the right leg and left hand.  She also complains of electrical shock like sensation in the hands. No specific triggers or pattern to these spells.    Over the past year, she has fallen 3-4 times, this occurred while mowing her grass, at the beach, and one occasional where she lost her balance.  She walks unassisted.    She also complains of generalized fatigue and excessive sleep.  This past weekend, she went to sleep on Friday night and did not wake up until Sunday, except to use the bathroom.  She has missed work in the past due to sleeping for days.  She denies any depressive symptoms or stress.  She complains of difficulty with attention, poor concentration, and memory changes.  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  She lives at home with her husband.    Out-side paper records, electronic medical record, and images have been reviewed where available and summarized as:   Lab Results  Component Value Date   TSH 3.82 10/08/2017     Past Medical History:  Diagnosis Date  . Allergic rhinitis   . Anxiety   . Asthma   . Depression   . GERD (gastroesophageal reflux disease)   . Iron deficiency anemia   . Pneumonia     Past Surgical History:  Procedure Laterality Date  . ENDOMETRIAL ABLATION  10/2008   HER OPTION ENDOMETRIAL ABLATION  . TONSILLECTOMY AND ADENOIDECTOMY       Medications:  Outpatient Encounter Medications as of 08/23/2019  Medication Sig  . Beclomethasone Dipropionate (QVAR IN) Inhale into the lungs.  . Calcium Carbonate-Vitamin D (CALCIUM + D PO) Take by mouth.  . fexofenadine (ALLEGRA) 60 MG tablet Take 60 mg by mouth daily.  . metroNIDAZOLE (METROCREAM) 0.75 % cream Apply topically 2 (two) times daily.  . montelukast (SINGULAIR) 10 MG tablet Take 10 mg by mouth at bedtime.  . Multiple Vitamin (MULTIVITAMIN) tablet Take 1 tablet by mouth daily.  Marland Kitchen. omeprazole (PRILOSEC) 20 MG capsule Take 20 mg by mouth daily.  Marland Kitchen. venlafaxine XR (EFFEXOR-XR) 150 MG 24 hr capsule Take 1 capsule (150 mg total) by mouth daily with breakfast.  . [DISCONTINUED] Azelastine-Fluticasone (DYMISTA) 137-50 MCG/ACT SUSP Place into the nose.  . [DISCONTINUED] Cetirizine HCl (ZYRTEC ALLERGY PO) Take by mouth.  . [DISCONTINUED] dexlansoprazole (DEXILANT) 60 MG capsule Take 60 mg by mouth daily.  . [DISCONTINUED] albuterol (PROVENTIL HFA;VENTOLIN HFA) 108 (90 Base) MCG/ACT inhaler Inhale into the lungs every 6 (six) hours as needed for wheezing or shortness  of breath.   No facility-administered encounter medications on file as of 08/23/2019.    Allergies:  Allergies  Allergen Reactions  . Prednisone Nausea Only    Family History: Family History  Problem Relation Age of Onset  . Hypertension Mother   . Dementia Mother   . Cancer Father        ESOPHAGEAL  . Diabetes Maternal Grandfather     Social History: Social History   Tobacco Use  . Smoking status: Passive Smoke Exposure - Never Smoker  . Smokeless  tobacco: Never Used  . Tobacco comment: exposed by Husband and Father  Substance Use Topics  . Alcohol use: No    Alcohol/week: 0.0 standard drinks  . Drug use: No   Social History   Social History Narrative   Right handed   Lives in a trilevel home with husband and dog    Review of Systems:  CONSTITUTIONAL: No fevers, chills, night sweats, or weight loss.   EYES: No visual changes or eye pain ENT: No hearing changes.  No history of nose bleeds.   RESPIRATORY: No cough, wheezing and shortness of breath.   CARDIOVASCULAR: Negative for chest pain, and palpitations.   GI: Negative for abdominal discomfort, blood in stools or black stools.  No recent change in bowel habits.   GU:  No history of incontinence.   MUSCLOSKELETAL: No history of joint pain or swelling.  No myalgias.   SKIN: Negative for lesions, rash, and itching.   HEMATOLOGY/ONCOLOGY: Negative for prolonged bleeding, bruising easily, and swollen nodes.  No history of cancer.   ENDOCRINE: Negative for cold or heat intolerance, polydipsia or goiter.   PSYCH:  No depression or anxiety symptoms.   NEURO: As Above.   Vital Signs:  BP 132/83   Pulse 98   Ht 5\' 4"  (1.626 m)   Wt 158 lb (71.7 kg)   SpO2 98%   BMI 27.12 kg/m    General Medical Exam:   General:  Well appearing, comfortable.   Eyes/ENT: see cranial nerve examination.   Neck:   No carotid bruits. Respiratory:  Clear to auscultation, good air entry bilaterally.   Cardiac:  Regular rate and rhythm, no murmur.   Extremities:  No edema, or skin discoloration.  Skin:  No rashes or lesions.  Neurological Exam: MENTAL STATUS including orientation to time, place, person, recent and remote memory, attention span and concentration, language, and fund of knowledge is normal.  Speech is not dysarthric.  CRANIAL NERVES: II:  No visual field defects.   III-IV-VI: Pupils equal round and reactive to light.  Normal conjugate, extra-ocular eye movements in all  directions of gaze.  No nystagmus.  No ptosis.   V:  Normal facial sensation.    VII:  Normal facial symmetry and movements.   VIII:  Normal hearing and vestibular function.   IX-X:  Normal palatal movement.   XI:  Normal shoulder shrug and head rotation.   XII:  Normal tongue is midline  MOTOR:  No atrophy, fasciculations or abnormal movements.  No pronator drift.   Upper Extremity:  Right  Left  Deltoid  5/5   5/5   Biceps  5/5   5/5   Triceps  5/5   5/5   Infraspinatus 5/5  5/5  Medial pectoralis 5/5  5/5  Wrist extensors  5/5   5/5   Wrist flexors  5/5   5/5   Finger extensors  5/5   5/5   Finger flexors  5/5   5/5   Dorsal interossei  5/5   5/5   Abductor pollicis  5/5   5/5   Tone (Ashworth scale)  0  0   Lower Extremity:  Right  Left  Hip flexors  5/5   5/5   Hip extensors  5/5   5/5   Adductor 5/5  5/5  Abductor 5/5  5/5  Knee flexors  5/5   5/5   Knee extensors  5/5   5/5   Dorsiflexors  5/5   5/5   Plantarflexors  5/5   5/5   Toe extensors  5/5   5/5   Toe flexors  5/5   5/5   Tone (Ashworth scale)  0  0   MSRs:  Right        Left                  brachioradialis 2+  2+  biceps 2+  2+  triceps 2+  2+  patellar 3+  3+  ankle jerk 2+  2+  Hoffman no  no  plantar response down  down   SENSORY:  Pin prick is reduced over the lower legs bilaterally, intact in the feet and hands.  Vibration and temperature is normal throughout.  Negative Tinel's sign bilaterally.   COORDINATION/GAIT: Normal finger-to- nose-finger.  Intact rapid alternating movements bilaterally.  Gait is normal, unassisted and stable.   IMPRESSION: Generalized paresthesias involving the legs > hands.   Sensory complaints are widespread and migratory, making it difficult to localize to peripheral nerve pathology.  Therefore, imaging of the brain wwo contrast will be obtained to evaluate for intracranial pathology, such as multiple sclerosis.  If negative, then MRI cervical spine and/or NCS/EMG  of the LEFT arm and RIGHT leg will be ordered.  I have asked her to start keeping a journal to see if there is a pattern or specific triggers.  Excessive sleepiness. She may want to see a sleep specialist for sleep-related complaints.  Further recommendations pending results.  Thank you for allowing me to participate in patient's care.  If I can answer any additional questions, I would be pleased to do so.    Sincerely,    Franceen Erisman K. Posey Pronto, DO

## 2019-08-24 ENCOUNTER — Other Ambulatory Visit: Payer: Self-pay | Admitting: Gynecology

## 2019-08-24 NOTE — Telephone Encounter (Signed)
Patient had annual exam on 08/17/19

## 2019-09-02 ENCOUNTER — Telehealth: Payer: Self-pay | Admitting: Neurology

## 2019-09-02 NOTE — Telephone Encounter (Signed)
Abigail Butts from [facility] is needing signed orders for patient's MRI on Monday. Please fax this to [phone]. And if you have any questions the call back # is [phone]. Thanks!

## 2019-09-02 NOTE — Telephone Encounter (Signed)
Signed order faxed

## 2019-09-06 ENCOUNTER — Encounter: Payer: Self-pay | Admitting: Neurology

## 2019-09-08 ENCOUNTER — Telehealth: Payer: Self-pay | Admitting: Neurology

## 2019-09-08 DIAGNOSIS — Z8673 Personal history of transient ischemic attack (TIA), and cerebral infarction without residual deficits: Secondary | ICD-10-CM

## 2019-09-08 DIAGNOSIS — R42 Dizziness and giddiness: Secondary | ICD-10-CM

## 2019-09-08 NOTE — Telephone Encounter (Signed)
Results of MRI brain discussed with patient which does not show demyelinating disease.  There is note of very small chronic infarctions involving bilateral cerebellar hemispheres.  I have personally called Dr. Manson Passey to review images as the body of the report indicates small infarct in the cerebral hemisphere.  I am sure this is a typo.  He will review and fax an addendum.    Either way, she has no clinical symptoms related to this.  She continues to report generalized fatigue, dizziness, and fatigue.   Recommend CTA head and neck to further evaluate her dizziness.    MRI brain wwo contrast 09/05/2018 performed at [facility]: 1.  No acute intracranial abnormality 2.  Chronic cerebellar infarctions 3.  Interval small discrete white matter lesion identified.  This lesion is nonspecific, usually resulting from benign/incidental causes or chronic ischemia associated with migraines/atherosclerosis/vasculopathies.

## 2019-09-09 ENCOUNTER — Other Ambulatory Visit: Payer: Self-pay

## 2019-09-09 DIAGNOSIS — R42 Dizziness and giddiness: Secondary | ICD-10-CM

## 2019-09-15 ENCOUNTER — Other Ambulatory Visit: Payer: Managed Care, Other (non HMO)

## 2019-09-21 ENCOUNTER — Telehealth: Payer: Self-pay | Admitting: Neurology

## 2019-09-21 NOTE — Telephone Encounter (Signed)
Patient stated that she was told from [facility] that they are pending Dr Allena Katz discussing with insurance, I see no documentation that they have reached out to make office aware. Patient stated that if this scan is "not that serious" since it has not been approved yet she doesn't see why she needs it. Patient states that she had been told the scan was urgent as she could "bleed out and die" but if insurance doesn't see that "what is the point"

## 2019-09-21 NOTE — Telephone Encounter (Signed)
Patient is calling in about her CT scan that she was supposed to have scheduled and she hasn't heard anything from anyone. Thanks!

## 2019-09-22 NOTE — Telephone Encounter (Signed)
See below

## 2019-09-22 NOTE — Telephone Encounter (Signed)
CTA head and neck was ordered because she was complaining of dizziness.  Please contact Esmerelda to see the status on her PA.

## 2019-10-14 NOTE — Telephone Encounter (Signed)
Patient is calling in today regarding both her insurances [address]. She said her MRI was denied because it was noted that she had Dizziness and now she believes [facility] has denied it again. She would like to speak with someone regarding this so she can move forward. Pease see other previous phone notes. Please Call. Thank you

## 2019-10-14 NOTE — Telephone Encounter (Signed)
Please advise 

## 2019-10-15 NOTE — Telephone Encounter (Signed)
Just spoke with Vladimir Crofts and informed the patient both insurances have approved scan now and she is calling to schedule.

## 2019-10-15 NOTE — Telephone Encounter (Signed)
Please contact Esmeralda and see the status on this.  If it needs a peer-to-peer, let me know the case number and number to contact.  Please let pt know we are looking into it. Thanks.

## 2019-10-26 ENCOUNTER — Telehealth: Payer: Self-pay

## 2019-10-26 NOTE — Telephone Encounter (Signed)
Patient wan advised that Last BD was here at [facility]. I confirmed with DRI because she was sure she had it there. She did not.  Lupita Leash called her back and scheduled her appt.

## 2019-10-26 NOTE — Telephone Encounter (Signed)
Ready to schedule BD test but order is in [facility] name and "they" need a new order with current practicing MD name.  I called her back and left message verifying she is wanting to schedule here in office as her last one 2013 was done here.

## 2019-10-30 ENCOUNTER — Ambulatory Visit: Payer: Managed Care, Other (non HMO)

## 2019-10-30 ENCOUNTER — Ambulatory Visit: Payer: Managed Care, Other (non HMO) | Attending: Internal Medicine

## 2019-10-30 DIAGNOSIS — Z23 Encounter for immunization: Secondary | ICD-10-CM | POA: Insufficient documentation

## 2019-10-30 NOTE — Progress Notes (Signed)
   Covid-19 Vaccination Clinic  Name:  Terri Gallegos    MRN: [id number] DOB: April 22, 1956  10/30/2019  Terri Gallegos was observed post Covid-19 immunization for 15 minutes without incidence. She was provided with Vaccine Information Sheet and instruction to access the V-Safe system.   Terri Gallegos was instructed to call 911 with any severe reactions post vaccine: Marland Kitchen Difficulty breathing  . Swelling of your face and throat  . A fast heartbeat  . A bad rash all over your body  . Dizziness and weakness    Immunizations Administered    Name Date Dose VIS Date Route   Pfizer COVID-19 Vaccine 10/30/2019  6:01 PM 0.3 mL 08/13/2019 Intramuscular   Manufacturer: [employer], [employer]   Lot: [id number]   NDC: [address]-5

## 2019-11-09 ENCOUNTER — Ambulatory Visit
Admission: RE | Admit: 2019-11-09 | Discharge: 2019-11-09 | Disposition: A | Discharge status: Home / Self Care | Payer: Managed Care, Other (non HMO) | Source: Ambulatory Visit | Attending: Neurology | Admitting: Neurology

## 2019-11-09 DIAGNOSIS — R42 Dizziness and giddiness: Secondary | ICD-10-CM

## 2019-11-09 MED ORDER — IOPAMIDOL (ISOVUE-370) INJECTION 76%
75.0000 mL | Freq: Once | INTRAVENOUS | Status: AC | PRN
  Administered 2019-11-09: 75 mL via INTRAVENOUS

## 2019-12-06 ENCOUNTER — Other Ambulatory Visit: Payer: Self-pay

## 2019-12-07 ENCOUNTER — Other Ambulatory Visit: Payer: Self-pay | Admitting: Obstetrics and Gynecology

## 2019-12-07 ENCOUNTER — Ambulatory Visit (INDEPENDENT_AMBULATORY_CARE_PROVIDER_SITE_OTHER): Payer: Managed Care, Other (non HMO)

## 2019-12-07 ENCOUNTER — Other Ambulatory Visit: Payer: Self-pay | Admitting: Obstetrics & Gynecology

## 2019-12-07 ENCOUNTER — Ambulatory Visit: Payer: Managed Care, Other (non HMO) | Attending: Internal Medicine

## 2019-12-07 DIAGNOSIS — Z780 Asymptomatic menopausal state: Secondary | ICD-10-CM

## 2019-12-07 DIAGNOSIS — Z23 Encounter for immunization: Secondary | ICD-10-CM

## 2019-12-07 DIAGNOSIS — Z01419 Encounter for gynecological examination (general) (routine) without abnormal findings: Secondary | ICD-10-CM

## 2019-12-07 NOTE — Progress Notes (Signed)
   Covid-19 Vaccination Clinic  Name:  Terri Gallegos    MRN: [id number] DOB: [birth year]  12/07/2019  Terri Gallegos was observed post Covid-19 immunization for 15 minutes without incident. She was provided with Vaccine Information Sheet and instruction to access the V-Safe system.   Terri Gallegos was instructed to call 911 with any severe reactions post vaccine: Marland Kitchen Difficulty breathing  . Swelling of face and throat  . A fast heartbeat  . A bad rash all over body  . Dizziness and weakness   Immunizations Administered    Name Date Dose VIS Date Route   Pfizer COVID-19 Vaccine 12/07/2019  3:17 PM 0.3 mL 08/13/2019 Intramuscular   Manufacturer: [employer], [employer]   Lot: [id number]   NDC: [address]-4

## 2020-02-21 ENCOUNTER — Telehealth: Payer: Self-pay | Admitting: *Deleted

## 2020-02-21 MED ORDER — VENLAFAXINE HCL ER 150 MG PO CP24: ORAL_CAPSULE | ORAL | 1 refills | Status: DC

## 2020-02-21 NOTE — Telephone Encounter (Signed)
Patient called stating she needs refill on Effexor XR 150 mg tablet, per note on 08/17/19 no menopausal symptoms. However Rx was sent to pharmacy by Dr.Fontaine on 08/24/19. Patient said she is having menopausal symptoms and would like to have Rx sent to [facility] on [address]. Rx sent.

## 2020-05-20 ENCOUNTER — Encounter: Payer: Self-pay | Admitting: Obstetrics and Gynecology

## 2020-08-06 ENCOUNTER — Emergency Department (HOSPITAL_COMMUNITY): Payer: Managed Care, Other (non HMO)

## 2020-08-06 ENCOUNTER — Emergency Department (HOSPITAL_COMMUNITY)
Admission: EM | Admit: 2020-08-06 | Discharge: 2020-08-06 | Disposition: A | Discharge status: Home / Self Care | Payer: Managed Care, Other (non HMO) | Attending: Emergency Medicine | Admitting: Emergency Medicine

## 2020-08-06 ENCOUNTER — Other Ambulatory Visit: Payer: Self-pay

## 2020-08-06 ENCOUNTER — Encounter (HOSPITAL_COMMUNITY): Payer: Self-pay | Admitting: Obstetrics and Gynecology

## 2020-08-06 DIAGNOSIS — S9032XA Contusion of left foot, initial encounter: Secondary | ICD-10-CM | POA: Insufficient documentation

## 2020-08-06 DIAGNOSIS — Z7722 Contact with and (suspected) exposure to environmental tobacco smoke (acute) (chronic): Secondary | ICD-10-CM | POA: Diagnosis not present

## 2020-08-06 DIAGNOSIS — Y9389 Activity, other specified: Secondary | ICD-10-CM | POA: Insufficient documentation

## 2020-08-06 DIAGNOSIS — Y9289 Other specified places as the place of occurrence of the external cause: Secondary | ICD-10-CM | POA: Insufficient documentation

## 2020-08-06 DIAGNOSIS — R030 Elevated blood-pressure reading, without diagnosis of hypertension: Secondary | ICD-10-CM

## 2020-08-06 DIAGNOSIS — Z23 Encounter for immunization: Secondary | ICD-10-CM | POA: Insufficient documentation

## 2020-08-06 DIAGNOSIS — W1789XA Other fall from one level to another, initial encounter: Secondary | ICD-10-CM | POA: Diagnosis not present

## 2020-08-06 DIAGNOSIS — S99922A Unspecified injury of left foot, initial encounter: Secondary | ICD-10-CM | POA: Diagnosis present

## 2020-08-06 DIAGNOSIS — S90812A Abrasion, left foot, initial encounter: Secondary | ICD-10-CM

## 2020-08-06 MED ORDER — TETANUS-DIPHTH-ACELL PERTUSSIS 5-2.5-18.5 LF-MCG/0.5 IM SUSY
0.5000 mL | PREFILLED_SYRINGE | Freq: Once | INTRAMUSCULAR | Status: AC
  Administered 2020-08-06: 0.5 mL via INTRAMUSCULAR
  Filled 2020-08-06: qty 0.5

## 2020-08-06 MED ORDER — IBUPROFEN 200 MG PO TABS
400.0000 mg | ORAL_TABLET | Freq: Once | ORAL | Status: DC
  Filled 2020-08-06: qty 2

## 2020-08-06 NOTE — Discharge Instructions (Addendum)
It was our pleasure to provide your ER care today - we hope that you feel better.  Keep area very clean. Wash with soap and water 1-2x/day.   Take acetaminophen as need.   Your blood pressure is high today - follow up with primary care doctor in 1-2 weeks.   Return to ER if worse, new symptoms, worsening/severe pain, infection of wound, fever, spreading redness, or other concern.

## 2020-08-06 NOTE — ED Triage Notes (Signed)
Patient presents to the ER for dropping a tree limb on foot. Patient reports she has also fallen x3 in 24 hours mechanically, denies injury.

## 2020-08-06 NOTE — ED Provider Notes (Signed)
[facility]-EMERGENCY DEPT Provider Note   CSN: [id number] Arrival date & time: 08/06/20  1758     History Chief Complaint  Patient presents with  . Fall  . Laceration    Richard Miu Huffstetler is a 64 y.o. female.  Patient c/o injury to left foot just pta today. Indicates family member was removing a branch from tree, and it accidentally fell onto the top of left foot. Contusion and abrasion to area. Pain to area, acute onset, moderate, dull, non radiating. Is able to walk. Last tetanus unknown. Denies numbness/weakness to foot. Denies any other pain or injury.   The history is provided by the patient.  Fall  Laceration Associated symptoms: no fever        Past Medical History:  Diagnosis Date  . Allergic rhinitis   . Anxiety   . Asthma   . Depression   . GERD (gastroesophageal reflux disease)   . Iron deficiency anemia   . Pneumonia     Patient Active Problem List   Diagnosis Date Noted  . Pain of right heel 03/03/2018  . Numbness of lower limb 03/03/2018  . Pain in right knee 03/03/2018  . Laryngopharyngeal reflux (LPR) 08/29/2017  . Recurrent sinusitis 08/29/2017    Past Surgical History:  Procedure Laterality Date  . ENDOMETRIAL ABLATION  10/2008   HER OPTION ENDOMETRIAL ABLATION  . TONSILLECTOMY AND ADENOIDECTOMY       OB History    Gravida  3   Para  3   Term  3   Preterm      AB      Living  3     SAB      TAB      Ectopic      Multiple      Live Births              Family History  Problem Relation Age of Onset  . Hypertension Mother   . Dementia Mother   . Cancer Father        ESOPHAGEAL  . Diabetes Maternal Grandfather     Social History   Tobacco Use  . Smoking status: Passive Smoke Exposure - Never Smoker  . Smokeless tobacco: Never Used  . Tobacco comment: exposed by Husband and Father  Vaping Use  . Vaping Use: Never used  Substance Use Topics  . Alcohol use: No    Alcohol/week: 0.0  standard drinks  . Drug use: No    Home Medications Prior to Admission medications   Medication Sig Start Date End Date Taking? Authorizing Provider  Beclomethasone Dipropionate (QVAR IN) Inhale into the lungs.    [provider]  Calcium Carbonate-Vitamin D (CALCIUM + D PO) Take by mouth.    [provider]  fexofenadine (ALLEGRA) 60 MG tablet Take 60 mg by mouth daily.    [provider]  metroNIDAZOLE (METROCREAM) 0.75 % cream Apply topically 2 (two) times daily. 08/18/14   Fontaine, Nadyne Coombes, MD  montelukast (SINGULAIR) 10 MG tablet Take 10 mg by mouth at bedtime.    [provider]  Multiple Vitamin (MULTIVITAMIN) tablet Take 1 tablet by mouth daily.    [provider]  omeprazole (PRILOSEC) 20 MG capsule Take 20 mg by mouth daily.    [provider]  venlafaxine XR (EFFEXOR-XR) 150 MG 24 hr capsule TAKE 1 CAPSULE ONCE DAILY WITH BREAKFAST. 02/21/20   Theresia Majors, MD    Allergies    Prednisone  Review of Systems   Review of Systems  Constitutional: Negative for fever.  Gastrointestinal: Negative for nausea and vomiting.  Musculoskeletal:       Contusion dorsum left foot/abrasion to foot.   Skin: Positive for wound.  Neurological: Negative for numbness.    Physical Exam Updated Vital Signs BP (!) 181/99 (BP Location: Right Arm)   Pulse 90   Temp 98.5 F (36.9 C) (Oral)   Resp 16   SpO2 99%   Physical Exam Vitals and nursing note reviewed.  Constitutional:      Appearance: Normal appearance. She is well-developed.  HENT:     Head: Atraumatic.     Nose: Nose normal.     Mouth/Throat:     Mouth: Mucous membranes are moist.  Eyes:     General: No scleral icterus.    Conjunctiva/sclera: Conjunctivae normal.  Neck:     Trachea: No tracheal deviation.  Cardiovascular:     Rate and Rhythm: Normal rate.     Pulses: Normal pulses.  Pulmonary:     Effort: Pulmonary effort is normal. No respiratory distress.   Genitourinary:    Comments: No cva tenderness.  Musculoskeletal:     Cervical back: Neck supple. No muscular tenderness.     Comments: Tenderness, abrasion, contusion, dorsum of left foot. No fb seen or felt. Dp/pt 2+. Normal cap refill in toes.   Skin:    General: Skin is warm and dry.     Findings: No rash.  Neurological:     Mental Status: She is alert.     Comments: Alert, speech normal. Left foot nvi.   Psychiatric:        Mood and Affect: Mood normal.     ED Results / Procedures / Treatments   Labs (all labs ordered are listed, but only abnormal results are displayed) Labs Reviewed - No data to display  EKG None  Radiology DG Foot Complete Left  Result Date: 08/06/2020 CLINICAL DATA:  Laceration to the foot. Concern for fracture or foreign body. Abrasion to the dorsal aspect of the foot, near the third metatarsal. EXAM: LEFT FOOT - COMPLETE 3+ VIEW COMPARISON:  None. FINDINGS: There is no acute displaced fracture or dislocation. No foreign body. There is soft tissue swelling about the dorsal aspect of the foot. IMPRESSION: No acute displaced fracture or dislocation. No foreign body. Electronically Signed   By: Katherine Mantle M.D.   On: 08/06/2020 19:15    Procedures Procedures (including critical care time)  Medications Ordered in ED Medications  Tdap (BOOSTRIX) injection 0.5 mL (has no administration in time range)    ED Course  I have reviewed the triage vital signs and the nursing notes.  Pertinent labs & imaging results that were available during my care of the patient were reviewed by me and considered in my medical decision making (see chart for details).    MDM Rules/Calculators/A&P                         Xrays.   Reviewed nursing notes and prior charts for additional history.   Xrays reviewed/interpreted by me - no fx.   Wound cleaned/sterile saline, irrigated. Sterile dressing applied. Ibuprofen po. Tetanus im.   Pt appears stable for  d/c.  PCP f/u included for elevated bp.   Return precautions provided.   Final Clinical Impression(s) / ED Diagnoses Final diagnoses:  None    Rx / DC Orders ED Discharge Orders  None       Cathren Laine, MD 08/06/20 Barry Brunner

## 2020-08-11 ENCOUNTER — Other Ambulatory Visit: Payer: Self-pay | Admitting: Obstetrics and Gynecology

## 2020-08-11 NOTE — Telephone Encounter (Signed)
Annual exam is scheduled 08/17/20 with Dr. Penni Bombard.

## 2020-08-17 ENCOUNTER — Other Ambulatory Visit: Payer: Self-pay

## 2020-08-17 ENCOUNTER — Ambulatory Visit (INDEPENDENT_AMBULATORY_CARE_PROVIDER_SITE_OTHER): Payer: Managed Care, Other (non HMO) | Admitting: Obstetrics and Gynecology

## 2020-08-17 ENCOUNTER — Encounter: Payer: Self-pay | Admitting: Obstetrics and Gynecology

## 2020-08-17 VITALS — BP 138/82 | Ht 64.0 in | Wt 164.0 lb

## 2020-08-17 DIAGNOSIS — Z13220 Encounter for screening for lipoid disorders: Secondary | ICD-10-CM | POA: Diagnosis not present

## 2020-08-17 DIAGNOSIS — Z01419 Encounter for gynecological examination (general) (routine) without abnormal findings: Secondary | ICD-10-CM | POA: Diagnosis not present

## 2020-08-17 DIAGNOSIS — Z1329 Encounter for screening for other suspected endocrine disorder: Secondary | ICD-10-CM

## 2020-08-17 NOTE — Progress Notes (Signed)
   Terri Gallegos 1955/09/24 [id number]  SUBJECTIVE:  64 y.o. G26P3003 female here for a breast and pelvic exam. She has no gynecologic concerns.  Current Outpatient Medications  Medication Sig Dispense Refill  . Beclomethasone Dipropionate (QVAR IN) Inhale into the lungs.    . Calcium Carbonate-Vitamin D (CALCIUM + D PO) Take by mouth.    . fexofenadine (ALLEGRA) 60 MG tablet Take 60 mg by mouth daily.    . metroNIDAZOLE (METROCREAM) 0.75 % cream Apply topically 2 (two) times daily. 45 g 2  . montelukast (SINGULAIR) 10 MG tablet Take 10 mg by mouth at bedtime.    . Multiple Vitamin (MULTIVITAMIN) tablet Take 1 tablet by mouth daily.    Marland Kitchen omeprazole (PRILOSEC) 20 MG capsule Take 20 mg by mouth daily.    Marland Kitchen venlafaxine XR (EFFEXOR-XR) 150 MG 24 hr capsule TAKE 1 CAPSULE ONCE DAILY WITH BREAKFAST. 90 capsule 0   No current facility-administered medications for this visit.   Allergies: Prednisone  No LMP recorded. Patient is postmenopausal.  Past medical history,surgical history, problem list, medications, allergies, family history and social history were all reviewed and documented as reviewed in the EPIC chart.  GYN ROS: no abnormal bleeding, pelvic pain or discharge, no breast pain or new or enlarging lumps on self exam.  No dysuria, urinary frequency, pain with urination, cloudy/malodorous urine.   OBJECTIVE:  BP 138/82   Ht 5\' 4"  (1.626 m)   Wt 164 lb (74.4 kg)   BMI 28.15 kg/m  The patient appears well, alert, oriented, in no distress. Heart: Regular rate and rhythm, no murmur Lungs: Clear to auscultation bilaterally, no wheezes, rhonchi, rales BREAST EXAM: breasts appear normal, no suspicious masses, no skin or nipple changes or axillary nodes  PELVIC EXAM: VULVA: normal appearing vulva with atrophic change, no masses, tenderness or lesions, VAGINA: normal appearing vagina with atrophic change, normal color and discharge, no lesions, CERVIX: normal appearing atrophic cervix  without discharge or lesions, UTERUS: uterus is normal size, shape, consistency and nontender, ADNEXA: normal adnexa in size, nontender and no masses  Chaperone: present during the examination  ASSESSMENT:  64 y.o. 77 here for a breast and pelvic exam  PLAN:   1. Postmenopausal. No significant menopausal symptoms. No vaginal bleeding. 2. Pap smear/HPV 2020.  No significant history of abnormal Pap smears.  Next Pap smear due 2025 following the current guidelines recommending the 5 year interval. 3. Mammogram 05/2020.  Normal breast exam today.  Continue with annual mammograms. 4. Colonoscopy 2018.  She will follow up at the interval recommended by her GI specialist. 5. DEXA 12/2019 normal.  Next DEXA recommended at 5 year interval.  Discussed calcium, vitamin D, weight bearing exercise. 6. Health maintenance.  Requests routine annual labs today so we will check TSH, CBC, CMP, lipid screen. To keep an eye on blood pressure.  She does have a primary doctor who she will plan to review the results with at her January visit.  Return annually or sooner, prn.  February MD 08/17/20

## 2020-08-18 LAB — COMPREHENSIVE METABOLIC PANEL
AG Ratio: 1.9 (calc) (ref 1.0–2.5)
ALT: 31 U/L — ABNORMAL HIGH (ref 6–29)
AST: 27 U/L (ref 10–35)
Albumin: 4.3 g/dL (ref 3.6–5.1)
Alkaline phosphatase (APISO): 103 U/L (ref 37–153)
BUN: 18 mg/dL (ref 7–25)
CO2: 26 mmol/L (ref 20–32)
Calcium: 10.1 mg/dL (ref 8.6–10.4)
Chloride: 104 mmol/L (ref 98–110)
Creat: 0.95 mg/dL (ref 0.50–0.99)
Globulin: 2.3 g/dL (calc) (ref 1.9–3.7)
Glucose, Bld: 103 mg/dL — ABNORMAL HIGH (ref 65–99)
Potassium: 4.4 mmol/L (ref 3.5–5.3)
Sodium: 140 mmol/L (ref 135–146)
Total Bilirubin: 0.5 mg/dL (ref 0.2–1.2)
Total Protein: 6.6 g/dL (ref 6.1–8.1)

## 2020-08-18 LAB — LIPID PANEL
Cholesterol: 237 mg/dL — ABNORMAL HIGH (ref <200)
HDL: 65 mg/dL (ref >=50)
LDL Cholesterol (Calc): 143 mg/dL (calc) — ABNORMAL HIGH
Non-HDL Cholesterol (Calc): 172 mg/dL (calc) — ABNORMAL HIGH (ref <130)
Total CHOL/HDL Ratio: 3.6 (calc) (ref <5.0)
Triglycerides: 159 mg/dL — ABNORMAL HIGH (ref <150)

## 2020-08-18 LAB — CBC
HCT: 42.3 % (ref 35.0–45.0)
Hemoglobin: 15 g/dL (ref 11.7–15.5)
MCH: 32.1 pg (ref 27.0–33.0)
MCHC: 35.5 g/dL (ref 32.0–36.0)
MCV: 90.6 fL (ref 80.0–100.0)
MPV: 10 fL (ref 7.5–12.5)
Platelets: 294 10*3/uL (ref 140–400)
RBC: 4.67 10*6/uL (ref 3.80–5.10)
RDW: 12.4 % (ref 11.0–15.0)
WBC: 4.5 10*3/uL (ref 3.8–10.8)

## 2020-08-18 LAB — TSH: TSH: 3.21 mIU/L (ref 0.40–4.50)

## 2020-08-23 ENCOUNTER — Telehealth: Payer: Self-pay | Admitting: *Deleted

## 2020-08-23 MED ORDER — VENLAFAXINE HCL ER 75 MG PO CP24: 75.0000 mg | ORAL_CAPSULE | Freq: Every day | ORAL | 3 refills | Status: DC

## 2020-08-23 NOTE — Telephone Encounter (Signed)
Left detailed message on cell Rx sent.  

## 2020-08-23 NOTE — Telephone Encounter (Signed)
Patient called requesting decrease on Effexor- XR 150 mg tablet. She would like to decrease to 75 mg dose,.take for menopausal symptoms. Okay to decrease dose?

## 2020-08-23 NOTE — Telephone Encounter (Signed)
Yes

## 2020-10-03 ENCOUNTER — Other Ambulatory Visit: Payer: Self-pay | Admitting: Obstetrics and Gynecology

## 2020-10-04 NOTE — Telephone Encounter (Signed)
Pharmacy sent note on Rx "REQUEST FOR 90 DAYS PRESCRIPTION." pharmacy said last filled on 09/11/20.  Rx sent again for 90 day supply

## 2021-10-26 ENCOUNTER — Other Ambulatory Visit (HOSPITAL_COMMUNITY): Payer: Self-pay | Admitting: Family Medicine

## 2021-10-26 DIAGNOSIS — E785 Hyperlipidemia, unspecified: Secondary | ICD-10-CM

## 2021-10-30 ENCOUNTER — Other Ambulatory Visit: Payer: Self-pay

## 2021-10-30 ENCOUNTER — Ambulatory Visit (HOSPITAL_BASED_OUTPATIENT_CLINIC_OR_DEPARTMENT_OTHER)
Admission: RE | Admit: 2021-10-30 | Discharge: 2021-10-30 | Disposition: A | Discharge status: Home / Self Care | Payer: Self-pay | Source: Ambulatory Visit | Attending: Family Medicine | Admitting: Family Medicine

## 2021-10-30 DIAGNOSIS — E785 Hyperlipidemia, unspecified: Secondary | ICD-10-CM | POA: Insufficient documentation

## 2022-10-28 ENCOUNTER — Other Ambulatory Visit: Payer: Self-pay | Admitting: Family Medicine

## 2022-10-28 DIAGNOSIS — I77819 Aortic ectasia, unspecified site: Secondary | ICD-10-CM

## 2022-11-19 ENCOUNTER — Encounter (HOSPITAL_COMMUNITY): Payer: Self-pay

## 2022-11-19 ENCOUNTER — Ambulatory Visit (HOSPITAL_COMMUNITY)
Admission: RE | Admit: 2022-11-19 | Discharge: 2022-11-19 | Disposition: A | Discharge status: Home / Self Care | Payer: Medicare Other | Source: Ambulatory Visit | Attending: Family Medicine | Admitting: Family Medicine

## 2022-11-19 DIAGNOSIS — I77819 Aortic ectasia, unspecified site: Secondary | ICD-10-CM

## 2022-11-19 MED ORDER — IOHEXOL 350 MG/ML SOLN
100.0000 mL | Freq: Once | INTRAVENOUS | Status: AC | PRN
  Administered 2022-11-19: 100 mL via INTRAVENOUS

## 2022-11-19 MED ORDER — SODIUM CHLORIDE (PF) 0.9 % IJ SOLN
INTRAMUSCULAR | Status: AC
  Filled 2022-11-19: qty 50

## 2023-02-05 ENCOUNTER — Ambulatory Visit: Payer: Medicare Other | Attending: Cardiology | Admitting: Cardiology

## 2023-02-05 ENCOUNTER — Encounter: Payer: Self-pay | Admitting: Cardiology

## 2023-02-05 VITALS — BP 114/70 | HR 60 | Ht 64.0 in | Wt 166.6 lb

## 2023-02-05 DIAGNOSIS — I77810 Thoracic aortic ectasia: Secondary | ICD-10-CM

## 2023-02-05 DIAGNOSIS — R0602 Shortness of breath: Secondary | ICD-10-CM | POA: Diagnosis present

## 2023-02-05 NOTE — Progress Notes (Signed)
Cardiology Office Note:    Date:  02/05/2023   ID:  Di, Bagot 05/17/1956, MRN [id number]  PCP:  Camie Patience, FNP   [facility] Providers Cardiologist:  Donato Schultz, MD     Referring MD: Camie Patience, FNP    History of Present Illness:    Terri Gallegos is a 67 y.o. female here for the evaluation of aortic ectasia, 42 mm. Had very low coronary calcium score of between 1 and 2. She has been diagnosed with asthma about 5 years ago, can get short of breath with activity.  Can also have hot flash or feel warmth during activity as well.  Denies any family history of aortic disease.  Non-smoker.   Past Medical History:  Diagnosis Date   Allergic rhinitis    Anxiety    Aortic ectasia (HCC)    Asthma    Depression    GERD (gastroesophageal reflux disease)    History of colon polyps    Hyperlipidemia    IBS (irritable bowel syndrome)    Iron deficiency anemia    Low vitamin D level    Mild asthma without complication    Pneumonia    Prediabetes     Past Surgical History:  Procedure Laterality Date   ENDOMETRIAL ABLATION  10/2008   HER OPTION ENDOMETRIAL ABLATION   TONSILLECTOMY AND ADENOIDECTOMY      Current Medications: Current Meds  Medication Sig   Beclomethasone Dipropionate (QVAR IN) Inhale into the lungs.   Calcium Carbonate-Vitamin D (CALCIUM + D PO) Take by mouth.   cetirizine (ZYRTEC) 10 MG tablet Take 10 mg by mouth daily.   fexofenadine (ALLEGRA) 60 MG tablet Take 60 mg by mouth daily.   metroNIDAZOLE (METROCREAM) 0.75 % cream Apply topically 2 (two) times daily.   montelukast (SINGULAIR) 10 MG tablet Take 10 mg by mouth at bedtime.   Multiple Vitamin (MULTIVITAMIN) tablet Take 1 tablet by mouth daily.   omeprazole (PRILOSEC) 40 MG capsule Take 40 mg by mouth every morning.   rosuvastatin (CRESTOR) 10 MG tablet Take 10 mg by mouth daily.   venlafaxine XR (EFFEXOR-XR) 75 MG 24 hr capsule TAKE 1 CAPSULE BY MOUTH DAILY WITH  BREAKFAST.     Allergies:   Prednisone   Social History   Socioeconomic History   Marital status: Married    Spouse name: Not on file   Number of children: Not on file   Years of education: Not on file   Highest education level: Not on file  Occupational History   Not on file  Tobacco Use   Smoking status: Passive Smoke Exposure - Never Smoker   Smokeless tobacco: Never   Tobacco comments:    exposed by Husband and Father  Vaping Use   Vaping Use: Never used  Substance and Sexual Activity   Alcohol use: No    Alcohol/week: 0.0 standard drinks of alcohol   Drug use: No   Sexual activity: Not Currently    Birth control/protection: Post-menopausal    Comment: 1st intercourse 67 yo-Fewer than 5 partners  Other Topics Concern   Not on file  Social History Narrative   Right handed   Lives in a trilevel home with husband and dog   Social Determinants of Health   Financial Resource Strain: Not on file  Food Insecurity: Not on file  Transportation Needs: Not on file  Physical Activity: Not on file  Stress: Not on file  Social Connections: Not on file  Family History: The patient's family history includes Cancer in her father; Dementia in her mother; Diabetes in her maternal grandfather; Hypertension in her mother.  ROS:   Please see the history of present illness.     All other systems reviewed and are negative.  EKGs/Labs/Other Studies Reviewed:    The following studies were reviewed today: Cardiac Studies & Procedures          CT SCANS  CT CARDIAC SCORING (SELF PAY ONLY) 10/30/2021  Addendum 10/30/2021  8:55 PM ADDENDUM REPORT: 10/30/2021 20:53  ADDENDUM: Cardiovascular Disease Risk stratification  EXAM: Coronary Calcium Score  TECHNIQUE: A gated, non-contrast computed tomography scan of the heart was performed using 3mm slice thickness. Axial images were analyzed on a dedicated workstation. Calcium scoring of the coronary arteries was performed  using the Agatston method.  FINDINGS: Coronary arteries: Normal origins.  Coronary Calcium Score:  Left main: 0  Left anterior descending artery: 0  Left circumflex artery: 0  Right coronary artery: 1.53  Total: 1.53  Percentile: 51  Pericardium: Normal.  Ascending Aorta: The proximal ascending aorta is aneurysmal (41.7 mm).  Non-cardiac: See separate report from [facility].  IMPRESSION: Coronary calcium score of 1.53. This was 6 percentile for age-, race-, and sex-matched controls.  Aneurysm of the proximal ascending aorta.  RECOMMENDATIONS: Coronary artery calcium (CAC) score is a strong predictor of incident coronary heart disease (CHD) and provides predictive information beyond traditional risk factors. CAC scoring is reasonable to use in the decision to withhold, postpone, or initiate statin therapy in intermediate-risk or selected borderline-risk asymptomatic adults (age 62-75 years and LDL-C >=70 to <190 mg/dL) who do not have diabetes or established atherosclerotic cardiovascular disease (ASCVD).* In intermediate-risk (10-year ASCVD risk >=7.5% to <20%) adults or selected borderline-risk (10-year ASCVD risk >=5% to <7.5%) adults in whom a CAC score is measured for the purpose of making a treatment decision the following recommendations have been made:  If CAC=0, it is reasonable to withhold statin therapy and reassess in 5 to 10 years, as long as higher risk conditions are absent (diabetes mellitus, family history of premature CHD in first degree relatives (males <55 years; females <65 years), cigarette smoking, or LDL >=190 mg/dL).  If CAC is 1 to 99, it is reasonable to initiate statin therapy for patients >=52 years of age.  If CAC is >=100 or >=75th percentile, it is reasonable to initiate statin therapy at any age.  Cardiology referral should be considered for patients with CAC scores >=400 or >=75th percentile.  *2018  AHA/ACC/AACVPR/AAPA/ABC/ACPM/ADA/AGS/APhA/ASPC/NLA/PCNA Guideline on the Management of Blood Cholesterol: A Report of the American College of Cardiology/American Heart Association Task Force on Clinical Practice Guidelines. J Am Coll Cardiol. 2019;73(24):3168-3209.  Thomasene Ripple, DO   Electronically Signed By: Thomasene Ripple D.O. On: 10/30/2021 20:53  Narrative EXAM: OVER-READ INTERPRETATION  CT CHEST  The following report is an over-read performed by radiologist Dr. Trudie Reed of [facility] on 10/30/2021. This over-read does not include interpretation of cardiac or coronary anatomy or pathology. The coronary calcium score interpretation by the cardiologist is attached.  COMPARISON:  Chest CT 08/07/2017.  FINDINGS: Ectasia of ascending thoracic aorta (4.2 cm in diameter). Within the visualized portions of the thorax there are no suspicious appearing pulmonary nodules or masses, there is no acute consolidative airspace disease, no pleural effusions, no pneumothorax and no lymphadenopathy. Visualized portions of the upper abdomen demonstrate multiple low-attenuation lesions within the visualized liver measuring up to 1.6 cm in segment 8,  incompletely characterized on today's non-contrast CT examination, but statistically likely to represent cysts. There are no aggressive appearing lytic or blastic lesions noted in the visualized portions of the skeleton.  IMPRESSION: 1. Ectasia of ascending thoracic aorta (4.2 cm in diameter). Recommend annual imaging followup by CTA or MRA. This recommendation follows 2010 ACCF/AHA/AATS/ACR/ASA/SCA/SCAI/SIR/STS/SVM Guidelines for the Diagnosis and Management of Patients with Thoracic Aortic Disease. Circulation. 2010; 121: B147-W295. Aortic aneurysm NOS (ICD10-I71.9).  Electronically Signed: By: Trudie Reed M.D. On: 10/30/2021 13:25           EKG: 02/05/2023-normal sinus rhythm 60 nonspecific ST-T wave  changes  Recent Labs: No results found for requested labs within last 365 days.  Recent Lipid Panel    Component Value Date/Time   CHOL 237 (H) 08/17/2020 0905   TRIG 159 (H) 08/17/2020 0905   HDL 65 08/17/2020 0905   CHOLHDL 3.6 08/17/2020 0905   VLDL 16 08/18/2014 1232   LDLCALC 143 (H) 08/17/2020 0905     Risk Assessment/Calculations:              Physical Exam:    VS:  BP 114/70   Pulse 60   Ht 5\' 4"  (1.626 m)   Wt 166 lb 9.6 oz (75.6 kg)   SpO2 95%   BMI 28.60 kg/m     Wt Readings from Last 3 Encounters:  02/05/23 166 lb 9.6 oz (75.6 kg)  08/17/20 164 lb (74.4 kg)  08/23/19 158 lb (71.7 kg)     GEN:  Well nourished, well developed in no acute distress HEENT: Normal NECK: No JVD; No carotid bruits LYMPHATICS: No lymphadenopathy CARDIAC: RRR, no murmurs, rubs, gallops RESPIRATORY:  Clear to auscultation without rales, wheezing or rhonchi  ABDOMEN: Soft, non-tender, non-distended MUSCULOSKELETAL:  No edema; No deformity  SKIN: Warm and dry NEUROLOGIC:  Alert and oriented x 3 PSYCHIATRIC:  Normal affect   ASSESSMENT:    1. Ascending aorta dilatation (HCC)   2. Shortness of breath    PLAN:    In order of problems listed above:  Coronary artery calcification -Agree with rosuvastatin 10 mg.  LDL close to 70.  Excellent.  Very low calcium score number.  Shortness of breath -We will check echocardiogram.  She has been diagnosed with asthma over the past 5 years.  I do want to make sure that she does not have any evidence of improper structure and function.  Dilated ascending aorta - 42 mm.  In 1 year we will repeat echocardiogram to measure her aorta.  Continue to monitor.  We discussed implications of this.  Excellent blood pressure.          Medication Adjustments/Labs and Tests Ordered: Current medicines are reviewed at length with the patient today.  Concerns regarding medicines are outlined above.  Orders Placed This Encounter  Procedures    EKG 12-Lead   ECHOCARDIOGRAM COMPLETE   ECHOCARDIOGRAM COMPLETE   No orders of the defined types were placed in this encounter.   Patient Instructions  Medication Instructions:  Your physician recommends that you continue on your current medications as directed. Please refer to the Current Medication list given to you today.  *If you need a refill on your cardiac medications before your next appointment, please call your pharmacy*   Lab Work: NONE If you have labs (blood work) drawn today and your tests are completely normal, you will receive your results only by: MyChart Message (if you have MyChart) OR A paper copy in the mail If  you have any lab test that is abnormal or we need to change your treatment, we will call you to review the results.   Testing/Procedures: Your physician has requested that you have an echocardiogram. Echocardiography is a painless test that uses sound waves to create images of your heart. It provides your doctor with information about the size and shape of your heart and how well your heart's chambers and valves are working. This procedure takes approximately one hour. There are no restrictions for this procedure. Please do NOT wear cologne, perfume, aftershave, or lotions (deodorant is allowed). Please arrive 15 minutes prior to your appointment time.   Repeat Echocardiogram in 1 year prior to follow up appointment.  Follow-Up: At [facility], you and your health needs are our priority.  As part of our continuing mission to provide you with exceptional heart care, we have created designated Provider Care Teams.  These Care Teams include your primary Cardiologist (physician) and Advanced Practice Providers (APPs -  Physician Assistants and Nurse Practitioners) who all work together to provide you with the care you need, when you need it.    Your next appointment:   1 year(s)  Provider:   Donato Schultz, MD      Signed, Donato Schultz, MD   02/05/2023 5:10 PM    [facility]

## 2023-02-05 NOTE — Patient Instructions (Signed)
Medication Instructions:  Your physician recommends that you continue on your current medications as directed. Please refer to the Current Medication list given to you today.  *If you need a refill on your cardiac medications before your next appointment, please call your pharmacy*   Lab Work: NONE If you have labs (blood work) drawn today and your tests are completely normal, you will receive your results only by: MyChart Message (if you have MyChart) OR A paper copy in the mail If you have any lab test that is abnormal or we need to change your treatment, we will call you to review the results.   Testing/Procedures: Your physician has requested that you have an echocardiogram. Echocardiography is a painless test that uses sound waves to create images of your heart. It provides your doctor with information about the size and shape of your heart and how well your heart's chambers and valves are working. This procedure takes approximately one hour. There are no restrictions for this procedure. Please do NOT wear cologne, perfume, aftershave, or lotions (deodorant is allowed). Please arrive 15 minutes prior to your appointment time.   Repeat Echocardiogram in 1 year prior to follow up appointment.  Follow-Up: At [facility], you and your health needs are our priority.  As part of our continuing mission to provide you with exceptional heart care, we have created designated Provider Care Teams.  These Care Teams include your primary Cardiologist (physician) and Advanced Practice Providers (APPs -  Physician Assistants and Nurse Practitioners) who all work together to provide you with the care you need, when you need it.    Your next appointment:   1 year(s)  Provider:   Donato Schultz, MD

## 2023-03-10 ENCOUNTER — Ambulatory Visit (HOSPITAL_COMMUNITY): Payer: Medicare Other | Attending: Cardiovascular Disease

## 2023-03-10 DIAGNOSIS — I77810 Thoracic aortic ectasia: Secondary | ICD-10-CM | POA: Insufficient documentation

## 2023-03-10 DIAGNOSIS — R0602 Shortness of breath: Secondary | ICD-10-CM | POA: Diagnosis not present

## 2023-03-10 LAB — ECHOCARDIOGRAM COMPLETE
Area-P 1/2: 2.76 cm2
P 1/2 time: 497 msec
S' Lateral: 3.5 cm

## 2023-08-19 ENCOUNTER — Ambulatory Visit: Admission: RE | Admit: 2023-08-19 | Payer: Medicare Other | Source: Ambulatory Visit

## 2024-02-09 ENCOUNTER — Ambulatory Visit (HOSPITAL_COMMUNITY)
Admission: RE | Admit: 2024-02-09 | Discharge: 2024-02-09 | Disposition: A | Discharge status: Home / Self Care | Payer: Medicare Other | Source: Ambulatory Visit | Attending: Cardiovascular Disease | Admitting: Cardiovascular Disease

## 2024-02-09 DIAGNOSIS — R0602 Shortness of breath: Secondary | ICD-10-CM

## 2024-02-09 DIAGNOSIS — I77810 Thoracic aortic ectasia: Secondary | ICD-10-CM

## 2024-02-10 LAB — ECHOCARDIOGRAM COMPLETE
Area-P 1/2: 2.17 cm2
P 1/2 time: 403 ms
S' Lateral: 3.56 cm

## 2024-02-11 ENCOUNTER — Ambulatory Visit: Payer: Self-pay | Admitting: Cardiology

## 2024-03-02 ENCOUNTER — Other Ambulatory Visit: Payer: Self-pay | Admitting: *Deleted

## 2024-03-02 DIAGNOSIS — I77810 Thoracic aortic ectasia: Secondary | ICD-10-CM

## 2024-03-02 DIAGNOSIS — I351 Nonrheumatic aortic (valve) insufficiency: Secondary | ICD-10-CM
